# Patient Record
Sex: Male | Born: 1980 | Race: White | Hispanic: No | Marital: Married | Smoking: Current every day smoker
Health system: Southern US, Community
[De-identification: ages and names within clinical notes are randomized; demographics above are authoritative.]

## PROBLEM LIST (undated history)

## (undated) DIAGNOSIS — J45909 Unspecified asthma, uncomplicated: Secondary | ICD-10-CM

## (undated) HISTORY — PX: WISDOM TOOTH EXTRACTION: SHX21

## (undated) HISTORY — PX: TONSILLECTOMY: SUR1361

## (undated) HISTORY — DX: Unspecified asthma, uncomplicated: J45.909

## (undated) HISTORY — PX: OTHER SURGICAL HISTORY: SHX169

---

## 2004-09-29 ENCOUNTER — Emergency Department: Payer: Self-pay | Admitting: Internal Medicine

## 2005-11-21 ENCOUNTER — Emergency Department: Payer: Self-pay | Admitting: Emergency Medicine

## 2005-11-22 ENCOUNTER — Emergency Department: Payer: Self-pay | Admitting: Emergency Medicine

## 2005-12-10 ENCOUNTER — Emergency Department: Payer: Self-pay | Admitting: Emergency Medicine

## 2008-01-14 ENCOUNTER — Emergency Department: Payer: Self-pay | Admitting: Emergency Medicine

## 2009-07-25 ENCOUNTER — Emergency Department: Payer: Self-pay | Admitting: Emergency Medicine

## 2014-03-01 ENCOUNTER — Ambulatory Visit: Payer: Self-pay | Admitting: Neurology

## 2015-01-22 ENCOUNTER — Encounter: Payer: Self-pay | Admitting: Family Medicine

## 2015-01-22 ENCOUNTER — Ambulatory Visit (INDEPENDENT_AMBULATORY_CARE_PROVIDER_SITE_OTHER): Payer: Managed Care, Other (non HMO) | Admitting: Family Medicine

## 2015-01-22 VITALS — BP 114/76 | HR 92 | Temp 98.4°F | Ht 71.6 in | Wt 256.0 lb

## 2015-01-22 DIAGNOSIS — R52 Pain, unspecified: Secondary | ICD-10-CM | POA: Diagnosis not present

## 2015-01-22 DIAGNOSIS — R509 Fever, unspecified: Secondary | ICD-10-CM | POA: Diagnosis not present

## 2015-01-22 DIAGNOSIS — J209 Acute bronchitis, unspecified: Secondary | ICD-10-CM

## 2015-01-22 LAB — INFLUENZA A+B AG, EIA
INFLUENZA B AG, EIA: NEGATIVE
Influenza A Ag, EIA: NEGATIVE

## 2015-01-22 LAB — PLEASE NOTE:

## 2015-01-22 MED ORDER — HYDROCOD POLST-CPM POLST ER 10-8 MG/5ML PO SUER: 5.0000 mL | Freq: Two times a day (BID) | ORAL | Status: DC

## 2015-01-22 MED ORDER — BENZONATATE 200 MG PO CAPS: 200.0000 mg | ORAL_CAPSULE | Freq: Two times a day (BID) | ORAL | Status: DC | PRN

## 2015-01-22 MED ORDER — AZITHROMYCIN 250 MG PO TABS: ORAL_TABLET | ORAL | Status: DC

## 2015-01-22 NOTE — Progress Notes (Signed)
BP 114/76 mmHg  Pulse 92  Temp(Src) 98.4 F (36.9 C)  Ht 5' 11.6" (1.819 m)  Wt 256 lb (116.121 kg)  BMI 35.10 kg/m2  SpO2 97%   Subjective:    Patient ID: Jason Manning, male    DOB: 1981-03-07, 34 y.o.   MRN: [id number]  HPI: Jason Manning is a 34 y.o. male  Chief Complaint  Patient presents with  . URI    X 9 days, cough, SOB, sore throat, Fatigue, fever and body aches.   UPPER RESPIRATORY TRACT INFECTION- 1.5 weeks Worst symptom: coughing Fever: yes Cough: yes Shortness of breath: yes Wheezing: yes Chest pain: yes, with cough Chest tightness: yes Chest congestion: yes Nasal congestion: yes Runny nose: yes Post nasal drip: no Sneezing: no Sore throat: yes Swollen glands: no Sinus pressure: no Headache: yes Face pain: no Toothache: no Ear pain: no  Ear pressure: no  Eyes red/itching:no Eye drainage/crusting: no  Vomiting: yes Rash: no Fatigue: yes Sick contacts: yes Strep contacts: no  Context: worse and stable Recurrent sinusitis: no Relief with OTC cold/cough medications: yes  Treatments attempted: cold/sinus, mucinex, anti-histamine, pseudoephedrine and cough syrup   Relevant past medical, surgical, family and social history reviewed and updated as indicated. Interim medical history since our last visit reviewed. Allergies and medications reviewed and updated.  Review of Systems  Constitutional: Negative.   HENT: Negative.   Respiratory: Negative.   Cardiovascular: Negative.   Gastrointestinal: Negative.   Psychiatric/Behavioral: Negative.     Per HPI unless specifically indicated above     Objective:    BP 114/76 mmHg  Pulse 92  Temp(Src) 98.4 F (36.9 C)  Ht 5' 11.6" (1.819 m)  Wt 256 lb (116.121 kg)  BMI 35.10 kg/m2  SpO2 97%  Wt Readings from Last 3 Encounters:  01/22/15 256 lb (116.121 kg)  01/30/14 244 lb (110.678 kg)    Physical Exam  Constitutional: He is oriented to person, place, and time. He appears  well-developed and well-nourished. No distress.  HENT:  Head: Normocephalic and atraumatic.  Right Ear: Hearing and external ear normal.  Left Ear: Hearing normal.  Nose: Nose normal.  Mouth/Throat: Oropharynx is clear and moist. No oropharyngeal exudate.  Eyes: Conjunctivae, EOM and lids are normal. Pupils are equal, round, and reactive to light. Right eye exhibits no discharge. Left eye exhibits no discharge. No scleral icterus.  Neck: Normal range of motion. Neck supple. No JVD present. No tracheal deviation present. No thyromegaly present.  Cardiovascular: Normal rate, regular rhythm, normal heart sounds and intact distal pulses.  Exam reveals no gallop and no friction rub.   No murmur heard. Pulmonary/Chest: Effort normal and breath sounds normal. No stridor. No respiratory distress. He has no wheezes. He has no rales. He exhibits no tenderness.  Musculoskeletal: Normal range of motion.  Lymphadenopathy:    He has no cervical adenopathy.  Neurological: He is alert and oriented to person, place, and time.  Skin: Skin is warm, dry and intact. No rash noted. He is not diaphoretic. No erythema. No pallor.  Psychiatric: He has a normal mood and affect. His speech is normal and behavior is normal. Judgment and thought content normal. Cognition and memory are normal.  Nursing note and vitals reviewed.   No results found for this or any previous visit.    Assessment & Plan:   Problem List Items Addressed This Visit    None    Visit Diagnoses    Acute bronchitis, unspecified organism    -  Primary    Will treat as bronchitis as he has been sick for 10 days. Z-pack sent to his pharmacy. Will treat symptoms with tussionex and tessalon perles. Call if worse    Fever, unspecified fever cause        Will check for flu. Flu negative.     Relevant Orders    Influenza A+B Ag, EIA    Body aches        Will check for flu.     Relevant Orders    Influenza A+B Ag, EIA        Follow up  plan: Return if symptoms worsen or fail to improve.

## 2015-02-19 ENCOUNTER — Ambulatory Visit (INDEPENDENT_AMBULATORY_CARE_PROVIDER_SITE_OTHER): Payer: Managed Care, Other (non HMO) | Admitting: Family Medicine

## 2015-02-19 ENCOUNTER — Encounter: Payer: Self-pay | Admitting: Family Medicine

## 2015-02-19 VITALS — BP 138/81 | HR 94 | Temp 98.0°F | Ht 71.0 in | Wt 252.0 lb

## 2015-02-19 DIAGNOSIS — J019 Acute sinusitis, unspecified: Secondary | ICD-10-CM | POA: Diagnosis not present

## 2015-02-19 DIAGNOSIS — Z720 Tobacco use: Secondary | ICD-10-CM

## 2015-02-19 MED ORDER — AMOXICILLIN-POT CLAVULANATE 875-125 MG PO TABS: 1.0000 | ORAL_TABLET | Freq: Two times a day (BID) | ORAL | Status: DC

## 2015-02-19 NOTE — Assessment & Plan Note (Signed)
Discussed smoking cessation use of over-the-counter products

## 2015-02-19 NOTE — Assessment & Plan Note (Signed)
Discussed sinusitis care and treatment Use of over-the-counter medications smoking cessation Discuss use of Augmentin and recommendations for 12 hours apart.

## 2015-02-19 NOTE — Progress Notes (Signed)
BP 138/81 mmHg  Pulse 94  Temp(Src) 98 F (36.7 C)  Ht 5\' 11"  (1.803 m)  Wt 252 lb (114.306 kg)  BMI 35.16 kg/m2  SpO2 98%   Subjective:    Patient ID: Jason LoftsAdam Richard Iiams, male    DOB: 04/29/1980, 34 y.o.   MRN: [id number]  HPI: Jason Loftsdam Richard Veenstra is a 34 y.o. male  Chief Complaint  Patient presents with  . paper work    health screening   patient with ongoing illness is been ongoing for over a month. Had a round of Z-Pak and erythromycin one from the walk-in 1 from here. Patient had x-ray showing pneumonia. Patient's has continued to work has a great deal of congestion some low-grade fever may be slightly better but still very sick. Has taken over-the-counter medications  Patient also trying to quit smoking is cut back from 2 packs a day to 3 force of the pack this is been over this last month. Also has paperwork to fill out for still smoking for his insurance.  Relevant past medical, surgical, family and social history reviewed and updated as indicated. Interim medical history since our last visit reviewed. Allergies and medications reviewed and updated.  Review of Systems  Constitutional: Positive for fever, chills and fatigue.  HENT: Positive for congestion, ear pain, rhinorrhea, sinus pressure, sore throat and voice change.   Eyes: Negative.   Respiratory: Positive for cough and wheezing.   Cardiovascular: Negative.   Genitourinary: Negative.     Per HPI unless specifically indicated above     Objective:    BP 138/81 mmHg  Pulse 94  Temp(Src) 98 F (36.7 C)  Ht 5\' 11"  (1.803 m)  Wt 252 lb (114.306 kg)  BMI 35.16 kg/m2  SpO2 98%  Wt Readings from Last 3 Encounters:  02/19/15 252 lb (114.306 kg)  01/22/15 256 lb (116.121 kg)  01/30/14 244 lb (110.678 kg)    Physical Exam  Constitutional: He is oriented to person, place, and time. He appears well-developed and well-nourished. No distress.  HENT:  Head: Normocephalic and atraumatic.  Right Ear:  Hearing and external ear normal.  Left Ear: Hearing and external ear normal.  Nose: Nose normal.  Mouth/Throat: Oropharynx is clear and moist.  Eyes: Conjunctivae and lids are normal. Right eye exhibits no discharge. Left eye exhibits no discharge. No scleral icterus.  Neck: Normal range of motion.  Cardiovascular: Normal rate, regular rhythm and normal heart sounds.   Pulmonary/Chest: Effort normal and breath sounds normal. No respiratory distress.  rhonchi  Musculoskeletal: Normal range of motion.  Lymphadenopathy:    He has no cervical adenopathy.  Neurological: He is alert and oriented to person, place, and time.  Skin: Skin is intact. No rash noted.  Psychiatric: He has a normal mood and affect. His speech is normal and behavior is normal. Judgment and thought content normal. Cognition and memory are normal.    Results for orders placed or performed in visit on 01/22/15  Influenza A+B Ag, EIA  Result Value Ref Range   Influenza A Ag, EIA Negative Negative   Influenza B Ag, EIA Negative Negative   Influenza Comment See note   Please note:  Result Value Ref Range   Please note: Comment       Assessment & Plan:   Problem List Items Addressed This Visit      Respiratory   Sinusitis, acute - Primary    Discussed sinusitis care and treatment Use of over-the-counter medications smoking cessation  Discuss use of Augmentin and recommendations for 12 hours apart.      Relevant Medications   amoxicillin-clavulanate (AUGMENTIN) 875-125 MG tablet     Other   Tobacco abuse    Discussed smoking cessation use of over-the-counter products          Follow up plan: Return for Physical Exam this winter.

## 2016-10-05 IMAGING — MR MRI HEAD WITHOUT AND WITH CONTRAST
9 of 11 series · 30 of 48 positions shown · IV contrast (multihance)
Comparison: CT head without contrast 01/14/2008

CLINICAL DATA: Episode of confusion with metallic taste in his
mouth 3 weeks ago. Dizziness. Possible seizure.

EXAM:
MRI HEAD WITHOUT AND WITH CONTRAST
TECHNIQUE: Multiplanar, multiecho pulse sequences of the brain and surrounding
structures were obtained without and with intravenous contrast.
CONTRAST:  20 mL MultiHance

[Series 2: T1 · sagittal · 5.0mm · 0.45mm/px · 2 of 29 slices shown]
[im 1/29]
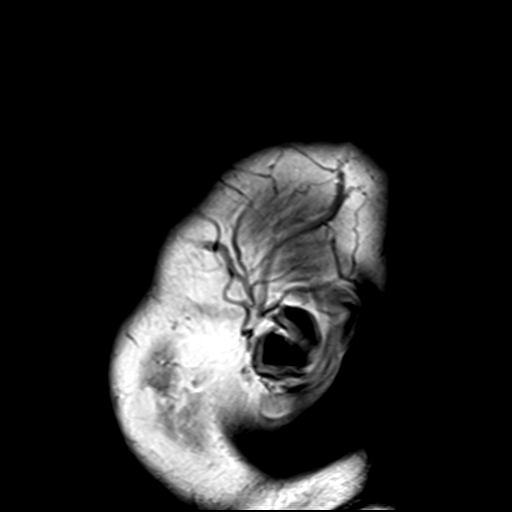
[im 10/29]
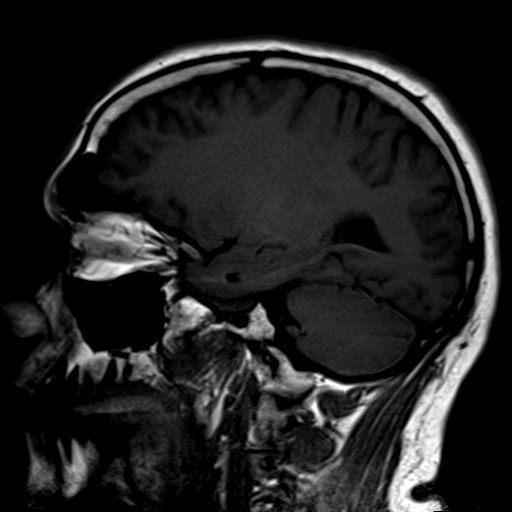

[Series 4: DWI · axial · 5.0mm · 1.80mm/px · z∈[-90,+78]mm · 3 of 27 slices shown (1 of 2)]
[im 1/27]
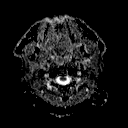
[im 14/27]
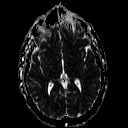
[im 27/27]
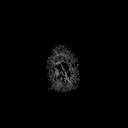

[Series 5: T2 · axial · 5.0mm · 0.60mm/px · z∈[-89,+79]mm · 3 of 27 slices shown (1 of 4)]
[im 1/27]
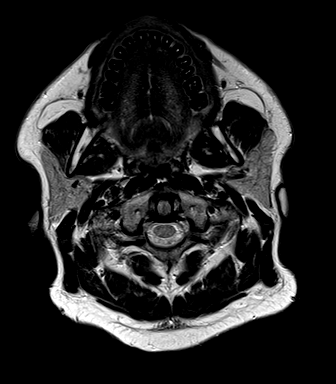
[im 14/27]
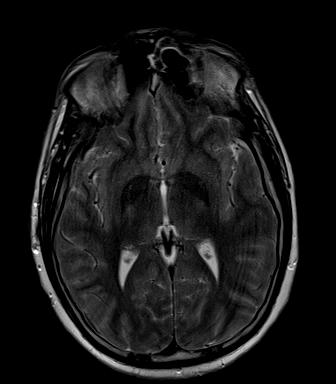
[im 27/27]
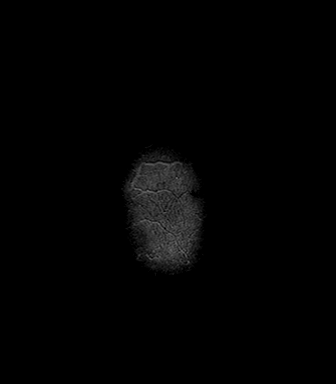

[Series 7: FLAIR · axial · 5.0mm · 0.45mm/px · z∈[-90,+78]mm · 3 of 27 slices shown]
[im 1/27]
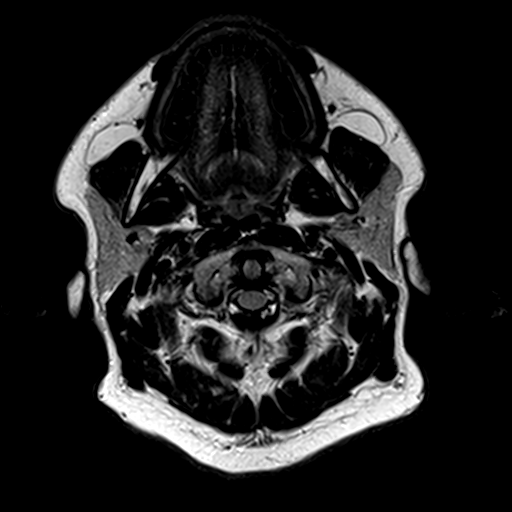
[im 14/27]
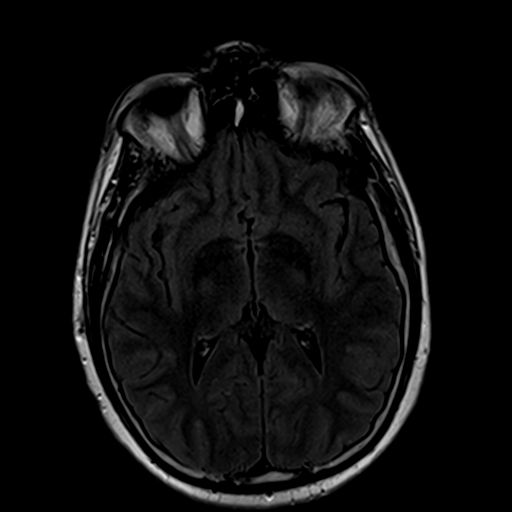
[im 27/27]
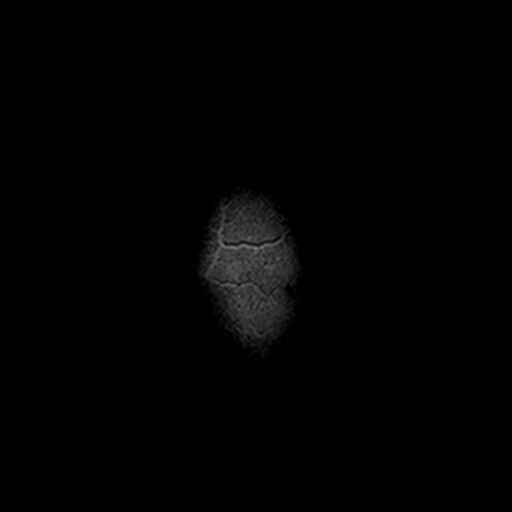

[Series 8: T2 · axial · 5.0mm · 0.45mm/px · z∈[-89,+79]mm · 3 of 27 slices shown (2 of 4)]
[im 1/27]
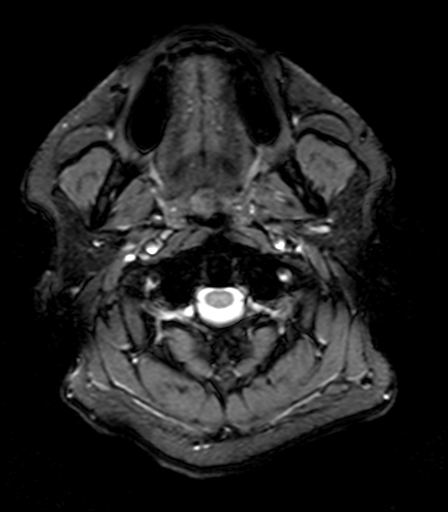
[im 14/27]
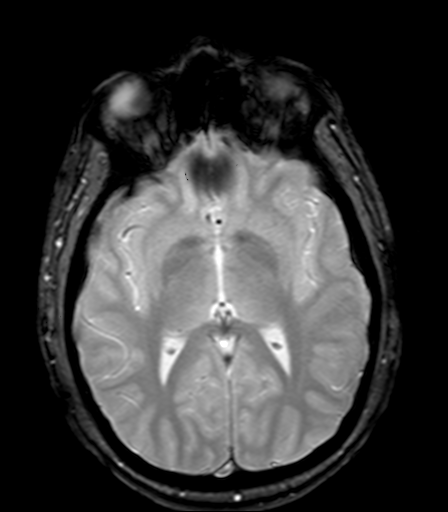
[im 27/27]
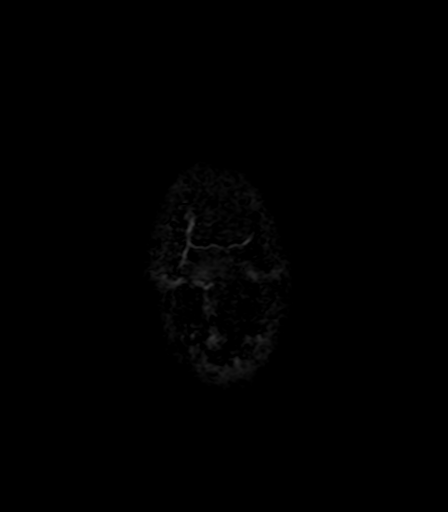

[Series 10: T2 · coronal · 3.0mm · 0.47mm/px · 5 of 37 slices shown (3 of 4)]
[im 1/37]
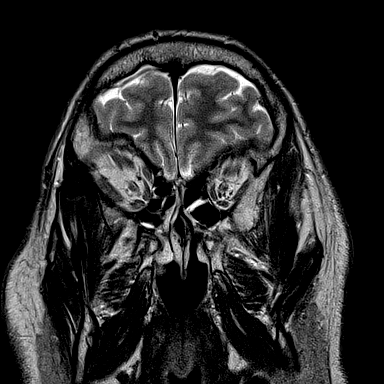
[im 10/37]
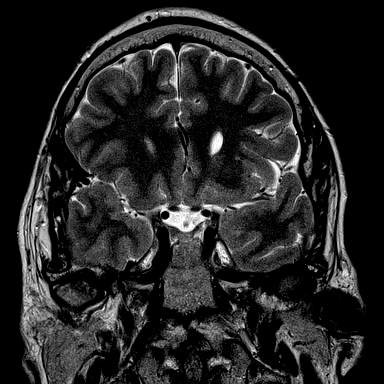
[im 19/37]
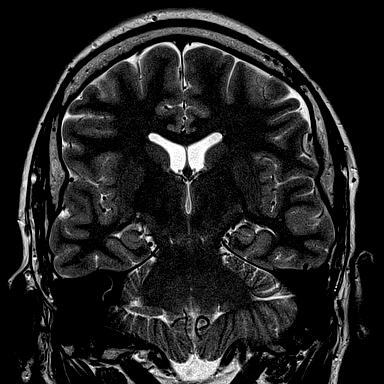
[im 28/37]
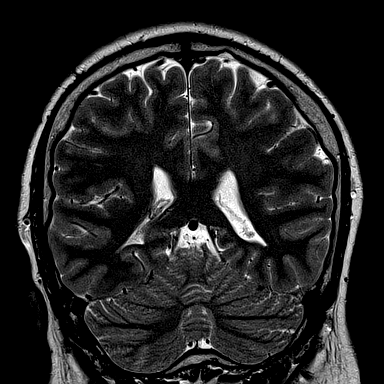
[im 37/37]
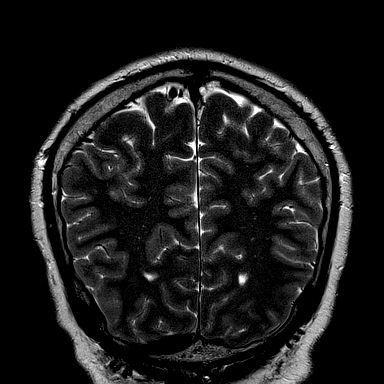

[Series 11: T2 · coronal · 5.0mm · 0.49mm/px · 4 of 31 slices shown (4 of 4)]
[im 1/31]
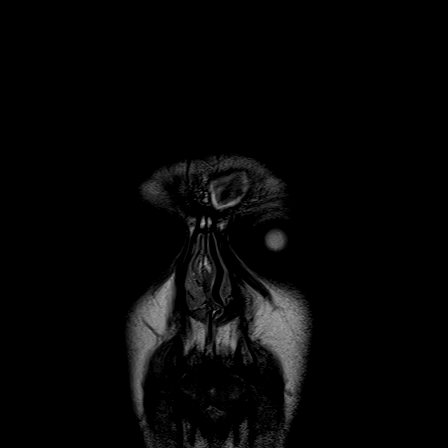
[im 11/31]
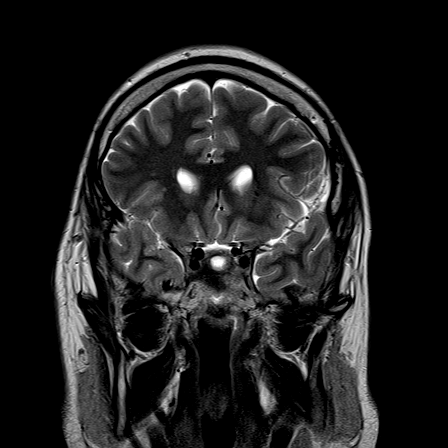
[im 21/31]
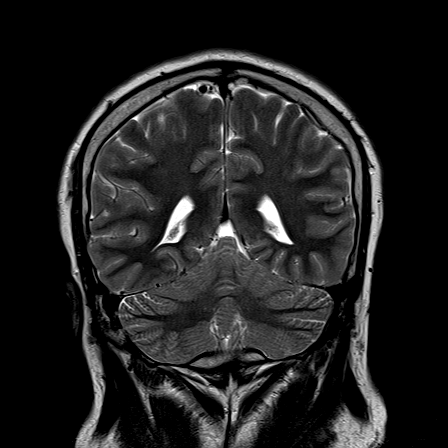
[im 31/31]
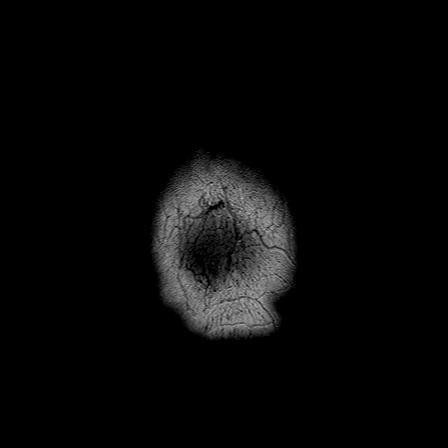

[Series 13: T1 post-contrast · coronal · 5.0mm · 0.43mm/px · 4 of 31 slices shown]
[im 1/31]
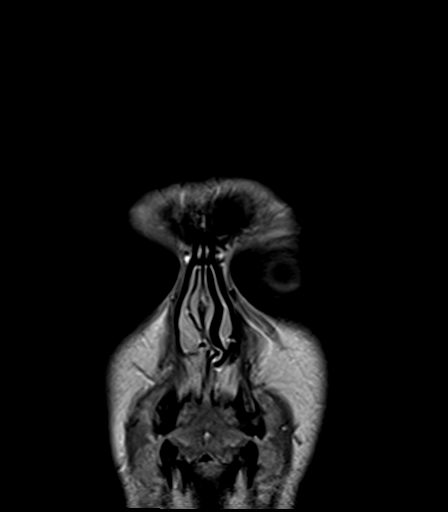
[im 11/31]
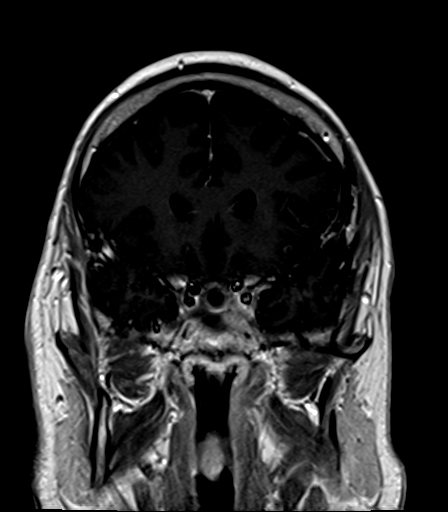
[im 21/31]
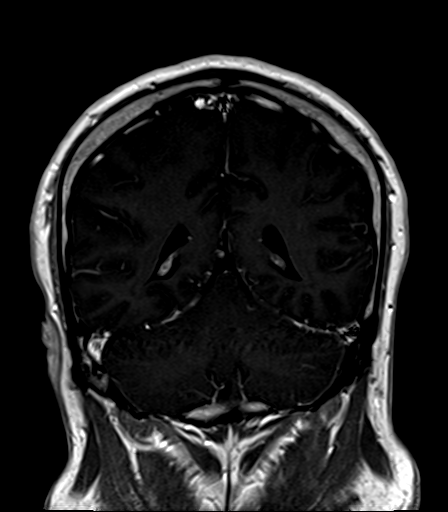
[im 31/31]
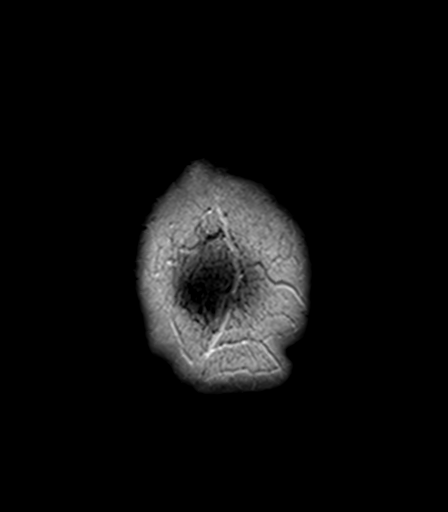

[Series 100: DWI · axial · 5.0mm · 1.80mm/px · z∈[-90,+78]mm · 3 of 27 slices shown (2 of 2)]
[im 1/27]
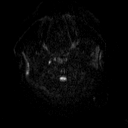
[im 14/27]
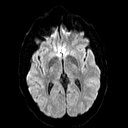
[im 27/27]
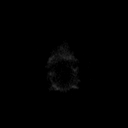

[30 of 48 positions shown; findings below may reference images not displayed]

FINDINGS: No acute infarct, hemorrhage, or mass lesion is present. A single
subcortical T2 hyperintensity is present in the left frontal lobe
measuring 5.5 mm. No other significant white matter disease is
present. The ventricles are of normal size. No significant
extraaxial fluid collection is present.

Flow is present in the major intracranial arteries. A polyp or
mucous retention cyst is present in the inferomedial left maxillary
sinus. Mild mucosal thickening is present throughout the paranasal
sinuses. The mastoid air cells are clear.

The postcontrast images demonstrate no pathologic enhancement is
present.
IMPRESSION: 1. 5.5 mm subcortical T2 hyperintensity in the anterior left frontal
lobe is nonspecific. This may be within normal limits. A
demyelinating process or association with chronic migraine headaches
is also considered.
2. No acute intracranial abnormality.
3. Minimal sinus disease.

## 2016-12-30 ENCOUNTER — Encounter: Payer: Managed Care, Other (non HMO) | Admitting: Family Medicine

## 2017-03-24 ENCOUNTER — Ambulatory Visit: Payer: Managed Care, Other (non HMO) | Admitting: Family Medicine

## 2017-03-24 ENCOUNTER — Encounter: Payer: Self-pay | Admitting: Family Medicine

## 2017-03-24 VITALS — BP 126/83 | HR 109 | Temp 97.8°F | Ht 72.25 in | Wt 263.0 lb

## 2017-03-24 DIAGNOSIS — Z0000 Encounter for general adult medical examination without abnormal findings: Secondary | ICD-10-CM

## 2017-03-24 DIAGNOSIS — Z720 Tobacco use: Secondary | ICD-10-CM

## 2017-03-24 LAB — UA/M W/RFLX CULTURE, ROUTINE
BILIRUBIN UA: NEGATIVE
Glucose, UA: NEGATIVE
LEUKOCYTES UA: NEGATIVE
NITRITE UA: NEGATIVE
PH UA: 5.5 (ref 5.0–7.5)
Protein, UA: NEGATIVE
RBC UA: NEGATIVE
SPEC GRAV UA: 1.025 (ref 1.005–1.030)
Urobilinogen, Ur: 0.2 mg/dL (ref 0.2–1.0)

## 2017-03-24 MED ORDER — VARENICLINE TARTRATE 0.5 MG X 11 & 1 MG X 42 PO MISC: ORAL | 0 refills | Status: DC

## 2017-03-24 NOTE — Assessment & Plan Note (Signed)
Ready to quit smoking, wanting to try chantix rather than OTC options. Discussed possible cost, pt aware. Risks and benefits reviewed, particularly suicidality. Discussed habit/routine replacement and avoiding exposure to friends and family smoking around

## 2017-03-24 NOTE — Progress Notes (Signed)
BP 126/83 (BP Location: Left Arm, Patient Position: Sitting, Cuff Size: Large)   Pulse (!) 109   Temp 97.8 F (36.6 C) (Oral)   Ht 6' 0.25" (1.835 m)   Wt 263 lb (119.3 kg)   SpO2 99%   BMI 35.42 kg/m    Subjective:    Patient ID: Jason Manning, male    DOB: 01-Sep-1980, 36 y.o.   MRN: [id number]  HPI: Jason Manning is a 36 y.o. male presenting on 03/24/2017 for comprehensive medical examination. Current medical complaints include:wanting to quit smoking. Quit cold [place] for a month several years ago but started back up after a night out with his buddies who smoke. Has never tried any means to quit.   Not fasting today for labs.   He currently lives with: wife and children Interim Problems from his last visit: no  Depression Screen done today and results listed below:  No flowsheet data found.  The patient does not have a history of falls. I did not complete a risk assessment for falls. A plan of care for falls was not documented.   Past Medical History:  Past Medical History:  Diagnosis Date  . Asthma     Surgical History:  Past Surgical History:  Procedure Laterality Date  . fracture of leg Left    Rod Placed  . reattachment of fingers Left   . TONSILLECTOMY    . WISDOM TOOTH EXTRACTION      Medications:  No current outpatient medications on file prior to visit.   No current facility-administered medications on file prior to visit.     Allergies:  No Known Allergies  Social History:  Social History   Socioeconomic History  . Marital status: Married    Spouse name: Not on file  . Number of children: Not on file  . Years of education: Not on file  . Highest education level: Not on file  Social Needs  . Financial resource strain: Not on file  . Food insecurity - worry: Not on file  . Food insecurity - inability: Not on file  . Transportation needs - medical: Not on file  . Transportation needs - non-medical: Not on file  Occupational  History  . Not on file  Tobacco Use  . Smoking status: Current Every Day Smoker    Packs/day: 2.00    Types: Cigarettes  . Smokeless tobacco: Never Used  Substance and Sexual Activity  . Alcohol use: No  . Drug use: Yes    Types: Marijuana  . Sexual activity: Yes  Other Topics Concern  . Not on file  Social History Narrative  . Not on file   Social History   Tobacco Use  Smoking Status Current Every Day Smoker  . Packs/day: 2.00  . Types: Cigarettes  Smokeless Tobacco Never Used   Social History   Substance and Sexual Activity  Alcohol Use No    Family History:  Family History  Problem Relation Age of Onset  . Thyroid disease Mother   . Hypertension Mother   . Lung disease Maternal Grandfather   . Heart disease Maternal Grandfather     Past medical history, surgical history, medications, allergies, family history and social history reviewed with patient today and changes made to appropriate areas of the chart.   Review of Systems - General ROS: negative Psychological ROS: negative Ophthalmic ROS: negative ENT ROS: negative Breast ROS: negative for breast lumps Respiratory ROS: no cough, shortness of breath, or wheezing Cardiovascular  ROS: no chest pain or dyspnea on exertion Gastrointestinal ROS: no abdominal pain, change in bowel habits, or black or bloody stools Genito-Urinary ROS: no dysuria, trouble voiding, or hematuria Musculoskeletal ROS: negative Neurological ROS: no TIA or stroke symptoms Dermatological ROS: negative All other ROS negative except what is listed above and in the HPI.      Objective:    BP 126/83 (BP Location: Left Arm, Patient Position: Sitting, Cuff Size: Large)   Pulse (!) 109   Temp 97.8 F (36.6 C) (Oral)   Ht 6' 0.25" (1.835 m)   Wt 263 lb (119.3 kg)   SpO2 99%   BMI 35.42 kg/m   Wt Readings from Last 3 Encounters:  03/24/17 263 lb (119.3 kg)  02/19/15 252 lb (114.3 kg)  01/22/15 256 lb (116.1 kg)    Physical Exam    Constitutional: He is oriented to person, place, and time. He appears well-developed and well-nourished. No distress.  HENT:  Head: Atraumatic.  Right Ear: External ear normal.  Left Ear: External ear normal.  Nose: Nose normal.  Mouth/Throat: Oropharynx is clear and moist.  Eyes: Conjunctivae are normal. Pupils are equal, round, and reactive to light. No scleral icterus.  Neck: Normal range of motion. Neck supple.  Cardiovascular: Normal rate, regular rhythm, normal heart sounds and intact distal pulses.  No murmur heard. Pulmonary/Chest: Effort normal and breath sounds normal. No respiratory distress.  Abdominal: Soft. Bowel sounds are normal. He exhibits no distension and no mass. There is no tenderness. There is no guarding.  Musculoskeletal: Normal range of motion. He exhibits no edema or tenderness.  Neurological: He is alert and oriented to person, place, and time. He has normal reflexes.  Skin: Skin is warm and dry. No rash noted.  Psychiatric: He has a normal mood and affect. His behavior is normal.  Nursing note and vitals reviewed.   Results for orders placed or performed in visit on 01/22/15  Influenza A+B Ag, EIA  Result Value Ref Range   Influenza A Ag, EIA Negative Negative   Influenza B Ag, EIA Negative Negative   Influenza Comment See note   Please note:  Result Value Ref Range   Please note: Comment       Assessment & Plan:   Problem List Items Addressed This Visit      Other   Tobacco abuse - Primary    Ready to quit smoking, wanting to try chantix rather than OTC options. Discussed possible cost, pt aware. Risks and benefits reviewed, particularly suicidality. Discussed habit/routine replacement and avoiding exposure to friends and family smoking around       Other Visit Diagnoses    Annual physical exam       Non-fasting labs done today. Declines flu shot, UTD otherwise.    Relevant Orders   CBC with Differential/Platelet   Comprehensive metabolic  panel   Lipid Panel w/o Chol/HDL Ratio   TSH   UA/M w/rflx Culture, Routine       Discussed aspirin prophylaxis for myocardial infarction prevention and decision was it was not indicated  LABORATORY TESTING:  Health maintenance labs ordered today as discussed above.   IMMUNIZATIONS:   - Tdap: Tetanus vaccination status reviewed: last tetanus booster within 10 years. - Influenza: Refused  PATIENT COUNSELING:    Sexuality: Discussed sexually transmitted diseases, partner selection, use of condoms, avoidance of unintended pregnancy  and contraceptive alternatives.   Advised to avoid cigarette smoking.  I discussed with the patient that most people  either abstain from alcohol or drink within safe limits (<=14/week and <=4 drinks/occasion for males, <=7/weeks and <= 3 drinks/occasion for females) and that the risk for alcohol disorders and other health effects rises proportionally with the number of drinks per week and how often a drinker exceeds daily limits.  Discussed cessation/primary prevention of drug use and availability of treatment for abuse.   Diet: Encouraged to adjust caloric intake to maintain  or achieve ideal body weight, to reduce intake of dietary saturated fat and total fat, to limit sodium intake by avoiding high sodium foods and not adding table salt, and to maintain adequate dietary potassium and calcium preferably from fresh fruits, vegetables, and low-fat dairy products.    stressed the importance of regular exercise  Injury prevention: Discussed safety belts, safety helmets, smoke detector, smoking near bedding or upholstery.   Dental health: Discussed importance of regular tooth brushing, flossing, and dental visits.   Follow up plan: NEXT PREVENTATIVE PHYSICAL DUE IN 1 YEAR. Return in about 3 months (around 06/22/2017) for Smoking cessation.

## 2017-03-25 LAB — COMPREHENSIVE METABOLIC PANEL WITH GFR
ALT: 16 IU/L (ref 0–44)
AST: 17 IU/L (ref 0–40)
Albumin/Globulin Ratio: 2.1 (ref 1.2–2.2)
Albumin: 4.5 g/dL (ref 3.5–5.5)
Alkaline Phosphatase: 61 IU/L (ref 39–117)
BUN/Creatinine Ratio: 16 (ref 9–20)
BUN: 18 mg/dL (ref 6–20)
Bilirubin Total: 0.5 mg/dL (ref 0.0–1.2)
CO2: 25 mmol/L (ref 20–29)
Calcium: 9.7 mg/dL (ref 8.7–10.2)
Chloride: 105 mmol/L (ref 96–106)
Creatinine, Ser: 1.1 mg/dL (ref 0.76–1.27)
GFR calc Af Amer: 99 mL/min/1.73
GFR calc non Af Amer: 86 mL/min/1.73
Globulin, Total: 2.1 g/dL (ref 1.5–4.5)
Glucose: 80 mg/dL (ref 65–99)
Potassium: 4.5 mmol/L (ref 3.5–5.2)
Sodium: 142 mmol/L (ref 134–144)
Total Protein: 6.6 g/dL (ref 6.0–8.5)

## 2017-03-25 LAB — CBC WITH DIFFERENTIAL/PLATELET
Basophils Absolute: 0 x10E3/uL (ref 0.0–0.2)
Basos: 0 %
EOS (ABSOLUTE): 0.4 x10E3/uL (ref 0.0–0.4)
Eos: 3 %
Hematocrit: 42.2 % (ref 37.5–51.0)
Hemoglobin: 14.4 g/dL (ref 13.0–17.7)
Immature Grans (Abs): 0 x10E3/uL (ref 0.0–0.1)
Immature Granulocytes: 0 %
Lymphocytes Absolute: 3.4 x10E3/uL — ABNORMAL HIGH (ref 0.7–3.1)
Lymphs: 31 %
MCH: 30.4 pg (ref 26.6–33.0)
MCHC: 34.1 g/dL (ref 31.5–35.7)
MCV: 89 fL (ref 79–97)
Monocytes Absolute: 0.8 x10E3/uL (ref 0.1–0.9)
Monocytes: 8 %
Neutrophils Absolute: 6.3 x10E3/uL (ref 1.4–7.0)
Neutrophils: 58 %
Platelets: 262 x10E3/uL (ref 150–379)
RBC: 4.73 x10E6/uL (ref 4.14–5.80)
RDW: 13 % (ref 12.3–15.4)
WBC: 11 x10E3/uL — ABNORMAL HIGH (ref 3.4–10.8)

## 2017-03-25 LAB — TSH: TSH: 1.15 u[IU]/mL (ref 0.450–4.500)

## 2017-03-25 LAB — LIPID PANEL W/O CHOL/HDL RATIO
CHOLESTEROL TOTAL: 208 mg/dL — AB (ref 100–199)
HDL: 39 mg/dL — ABNORMAL LOW (ref >39)
LDL CALC: 147 mg/dL — AB (ref 0–99)
Triglycerides: 112 mg/dL (ref 0–149)
VLDL Cholesterol Cal: 22 mg/dL (ref 5–40)

## 2017-03-27 ENCOUNTER — Telehealth: Payer: Self-pay | Admitting: Family Medicine

## 2017-03-27 ENCOUNTER — Telehealth: Payer: Self-pay

## 2017-03-27 NOTE — Telephone Encounter (Signed)
[facility] Result was given to patient by me.

## 2017-03-27 NOTE — Telephone Encounter (Signed)
Noted and faxing back to pharmacy.

## 2017-03-27 NOTE — Telephone Encounter (Signed)
Keep chantix - if patient decides cost is too high he can call and ask for cheaper alternatives

## 2017-03-27 NOTE — Telephone Encounter (Signed)
Prior Authorization from [facility] for chantix. Patient must first try and fail: Bupropion HCL ER, Nicotine Placrilex.  See where you documented you discussed possible cost w/ patient and he understood. Keep chantix or change?

## 2017-03-27 NOTE — Telephone Encounter (Signed)
Copied from CRM [phone]. Topic: Inquiry >> Mar 27, 2017  3:59 PM Stephannie LiSimmons, Allysa Governale L, NT wrote: Reason for CRM: Patient returned call from office please call again [phone]

## 2019-05-13 ENCOUNTER — Telehealth: Payer: Self-pay | Admitting: Family Medicine

## 2019-05-13 NOTE — Telephone Encounter (Signed)
Patient's wife, Marke Goodwyn is calling to ask if [facility] would take the paitent her husband on as a new patient. Please advise Cb- [phone]

## 2019-05-14 NOTE — Telephone Encounter (Signed)
This is fine.  JB

## 2019-06-24 ENCOUNTER — Ambulatory Visit: Payer: BC Managed Care – PPO | Admitting: Physician Assistant

## 2019-06-24 ENCOUNTER — Encounter: Payer: Self-pay | Admitting: Physician Assistant

## 2019-06-24 VITALS — BP 141/87 | HR 109 | Temp 97.5°F | Ht 73.0 in | Wt 280.0 lb

## 2019-06-24 DIAGNOSIS — Z23 Encounter for immunization: Secondary | ICD-10-CM

## 2019-06-24 DIAGNOSIS — Z1329 Encounter for screening for other suspected endocrine disorder: Secondary | ICD-10-CM | POA: Diagnosis not present

## 2019-06-24 DIAGNOSIS — Z13220 Encounter for screening for lipoid disorders: Secondary | ICD-10-CM

## 2019-06-24 DIAGNOSIS — Z114 Encounter for screening for human immunodeficiency virus [HIV]: Secondary | ICD-10-CM

## 2019-06-24 DIAGNOSIS — Z131 Encounter for screening for diabetes mellitus: Secondary | ICD-10-CM

## 2019-06-24 DIAGNOSIS — Z136 Encounter for screening for cardiovascular disorders: Secondary | ICD-10-CM

## 2019-06-24 DIAGNOSIS — Z0000 Encounter for general adult medical examination without abnormal findings: Secondary | ICD-10-CM

## 2019-06-24 NOTE — Progress Notes (Signed)
Patient: Jason Manning, Male    DOB: Jan 21, 1981, 39 y.o.   MRN: [id number] Visit Date: 06/24/2019  Today's Provider: Mar Daring, PA-C   Chief Complaint  Patient presents with  . Establish Care   Subjective:     Establish Care Jason Manning is a 39 y.o. male who presents today to establish care. He feels well. He reports exercising regularly. He reports he is sleeping fairly well.  He is married with children. He works as a Horticulturist, commercial for [employer]. He does smoke 2 PPD. He does not drink. He does use marijuana occasionally.  -----------------------------------------------------------------   Review of Systems  Constitutional: Negative.   HENT: Negative.   Eyes: Negative.   Respiratory: Negative.   Cardiovascular: Negative.   Gastrointestinal: Negative.   Endocrine: Negative.   Genitourinary: Negative.   Musculoskeletal: Negative.   Skin: Negative.   Allergic/Immunologic: Negative.   Neurological: Negative.   Hematological: Negative.   Psychiatric/Behavioral: Negative.     Social History      He  reports that he has been smoking cigarettes. He has been smoking about 2.00 packs per day. He has never used smokeless tobacco. He reports current drug use. Drug: Marijuana. He reports that he does not drink alcohol.       Social History   Socioeconomic History  . Marital status: Married    Spouse name: Not on file  . Number of children: Not on file  . Years of education: Not on file  . Highest education level: Not on file  Occupational History  . Not on file  Tobacco Use  . Smoking status: Current Every Day Smoker    Packs/day: 2.00    Types: Cigarettes  . Smokeless tobacco: Never Used  Substance and Sexual Activity  . Alcohol use: No  . Drug use: Yes    Types: Marijuana  . Sexual activity: Yes  Other Topics Concern  . Not on file  Social History Narrative  . Not on file   Social Determinants of Health   Financial Resource  Strain:   . Difficulty of Paying Living Expenses: Not on file  Food Insecurity:   . Worried About Charity fundraiser in the Last Year: Not on file  . Ran Out of Food in the Last Year: Not on file  Transportation Needs:   . Lack of Transportation (Medical): Not on file  . Lack of Transportation (Non-Medical): Not on file  Physical Activity:   . Days of Exercise per Week: Not on file  . Minutes of Exercise per Session: Not on file  Stress:   . Feeling of Stress : Not on file  Social Connections:   . Frequency of Communication with Friends and Family: Not on file  . Frequency of Social Gatherings with Friends and Family: Not on file  . Attends Religious Services: Not on file  . Active Member of Clubs or Organizations: Not on file  . Attends Archivist Meetings: Not on file  . Marital Status: Not on file    Past Medical History:  Diagnosis Date  . Asthma      Patient Active Problem List   Diagnosis Date Noted  . Sinusitis, acute 02/19/2015  . Tobacco abuse 02/19/2015    Past Surgical History:  Procedure Laterality Date  . fracture of leg Left    Rod Placed  . reattachment of fingers Left   . TONSILLECTOMY    . WISDOM TOOTH EXTRACTION  Family History        Family Status  Relation Name Status  . Mother  Alive  . Father  Alive  . Brother  Alive  . Daughter  Alive  . MGM  Deceased  . MGF  Deceased  . PGM  Alive  . PGF  Deceased  . Daughter  Alive        His family history includes Healthy in his father; Heart disease in his maternal grandfather; Hypertension in his mother; Lung disease in his maternal grandfather; Thyroid disease in his mother.      No Known Allergies   Current Outpatient Medications:  .  varenicline (CHANTIX STARTING MONTH PAK) 0.5 MG X 11 & 1 MG X 42 tablet, Take one 0.5 mg tablet by mouth once daily for 3 days, then increase to one 0.5 mg tablet twice daily for 4 days, then increase to one 1 mg tablet twice daily., Disp: 53  tablet, Rfl: 0   Patient Care Team: Margaretann Loveless, PA-C as PCP - General (Family Medicine)    Objective:    Vitals: BP (!) 141/87 (BP Location: Left Arm, Patient Position: Sitting, Cuff Size: Large)   Pulse (!) 109   Temp (!) 97.5 F (36.4 C) (Temporal)   Ht 6\' 1"  (1.854 m)   Wt 280 lb (127 kg)   BMI 36.94 kg/m    Vitals:   06/24/19 1610  BP: (!) 141/87  Pulse: (!) 109  Temp: (!) 97.5 F (36.4 C)  TempSrc: Temporal  Weight: 280 lb (127 kg)  Height: 6\' 1"  (1.854 m)     Physical Exam Vitals reviewed.  Constitutional:      General: He is not in acute distress.    Appearance: Normal appearance. He is well-developed. He is obese. He is not ill-appearing.  HENT:     Head: Normocephalic and atraumatic.     Right Ear: Tympanic membrane, ear canal and external ear normal.     Left Ear: Tympanic membrane, ear canal and external ear normal.  Eyes:     General: No scleral icterus.       Right eye: No discharge.        Left eye: No discharge.     Extraocular Movements: Extraocular movements intact.     Conjunctiva/sclera: Conjunctivae normal.     Pupils: Pupils are equal, round, and reactive to light.  Neck:     Thyroid: No thyromegaly.     Trachea: No tracheal deviation.  Cardiovascular:     Rate and Rhythm: Normal rate and regular rhythm.     Pulses: Normal pulses.     Heart sounds: Normal heart sounds. No murmur.  Pulmonary:     Effort: Pulmonary effort is normal. No respiratory distress.     Breath sounds: Normal breath sounds. No wheezing or rales.  Chest:     Chest wall: No tenderness.  Abdominal:     General: Bowel sounds are normal. There is no distension.     Palpations: Abdomen is soft. There is no mass.     Tenderness: There is no abdominal tenderness. There is no guarding or rebound.  Musculoskeletal:        General: No tenderness. Normal range of motion.     Cervical back: Normal range of motion and neck supple.     Right lower leg: No edema.      Left lower leg: No edema.  Lymphadenopathy:     Cervical: No cervical adenopathy.  Skin:  General: Skin is warm and dry.     Capillary Refill: Capillary refill takes less than 2 seconds.     Findings: No erythema or rash.  Neurological:     General: No focal deficit present.     Mental Status: He is alert and oriented to person, place, and time. Mental status is at baseline.     Cranial Nerves: No cranial nerve deficit.     Motor: No abnormal muscle tone.     Coordination: Coordination normal.     Deep Tendon Reflexes: Reflexes are normal and symmetric. Reflexes normal.  Psychiatric:        Mood and Affect: Mood normal.        Behavior: Behavior normal.        Thought Content: Thought content normal.        Judgment: Judgment normal.      Depression Screen PHQ 2/9 Scores 03/24/2017  PHQ - 2 Score 0  PHQ- 9 Score 0       Assessment & Plan:     Routine Health Maintenance and Physical Exam  Exercise Activities and Dietary recommendations Goals   None     Immunization History  Administered Date(s) Administered  . Td 04/25/2006    Health Maintenance  Topic Date Due  . HIV Screening  12/25/1995  . TETANUS/TDAP  04/25/2016  . INFLUENZA VACCINE  11/24/2018     Discussed health benefits of physical activity, and encouraged him to engage in regular exercise appropriate for his age and condition.    1. Annual physical exam Normal physical exam today. Will check labs as below and f/u pending lab results. If labs are stable and WNL he will not need to have these rechecked for one year at his next annual physical exam. He is to call the office in the meantime if he has any acute issue, questions or concerns. - CBC w/Diff/Platelet - Comprehensive Metabolic Panel (CMET) - TSH - Lipid Panel With LDL/HDL Ratio - HgB A1c  2. Thyroid disorder screen Will check labs as below and f/u pending results. - TSH  3. Encounter for lipid screening for cardiovascular  disease Will check labs as below and f/u pending results. - Lipid Panel With LDL/HDL Ratio  4. Diabetes mellitus screening Will check labs as below and f/u pending results. - HgB A1c  5. Screening for HIV without presence of risk factors Will check labs as below and f/u pending results. - HIV antibody (with reflex)  6. Need for Td vaccine Tdap Vaccine given to patient without complications. Patient sat for 15 minutes after administration and was tolerated well without adverse effects. - Tdap vaccine greater than or equal to 7yo IM   --------------------------------------------------------------------    Margaretann Loveless, PA-C  [facility]

## 2019-06-24 NOTE — Patient Instructions (Signed)

## 2019-06-26 NOTE — Addendum Note (Signed)
Addended by: Margaretann Loveless on: 06/26/2019 04:54 PM   Modules accepted: Level of Service

## 2019-07-26 DIAGNOSIS — Z1329 Encounter for screening for other suspected endocrine disorder: Secondary | ICD-10-CM | POA: Diagnosis not present

## 2019-07-26 DIAGNOSIS — Z13220 Encounter for screening for lipoid disorders: Secondary | ICD-10-CM | POA: Diagnosis not present

## 2019-07-26 DIAGNOSIS — Z136 Encounter for screening for cardiovascular disorders: Secondary | ICD-10-CM | POA: Diagnosis not present

## 2019-07-26 DIAGNOSIS — Z131 Encounter for screening for diabetes mellitus: Secondary | ICD-10-CM | POA: Diagnosis not present

## 2019-07-26 DIAGNOSIS — Z0000 Encounter for general adult medical examination without abnormal findings: Secondary | ICD-10-CM | POA: Diagnosis not present

## 2019-07-27 LAB — LIPID PANEL WITH LDL/HDL RATIO
Cholesterol, Total: 198 mg/dL (ref 100–199)
HDL: 44 mg/dL (ref >39)
LDL Chol Calc (NIH): 143 mg/dL — ABNORMAL HIGH (ref 0–99)
LDL/HDL Ratio: 3.3 ratio (ref 0.0–3.6)
Triglycerides: 62 mg/dL (ref 0–149)
VLDL Cholesterol Cal: 11 mg/dL (ref 5–40)

## 2019-07-27 LAB — COMPREHENSIVE METABOLIC PANEL
ALT: 19 IU/L (ref 0–44)
AST: 15 IU/L (ref 0–40)
Albumin/Globulin Ratio: 1.8 (ref 1.2–2.2)
Albumin: 4.2 g/dL (ref 4.0–5.0)
Alkaline Phosphatase: 65 IU/L (ref 39–117)
BUN/Creatinine Ratio: 16 (ref 9–20)
BUN: 14 mg/dL (ref 6–20)
Bilirubin Total: 0.3 mg/dL (ref 0.0–1.2)
CO2: 21 mmol/L (ref 20–29)
Calcium: 9.2 mg/dL (ref 8.7–10.2)
Chloride: 107 mmol/L — ABNORMAL HIGH (ref 96–106)
Creatinine, Ser: 0.87 mg/dL (ref 0.76–1.27)
GFR calc Af Amer: 127 mL/min/{1.73_m2} (ref >59)
GFR calc non Af Amer: 109 mL/min/{1.73_m2} (ref >59)
Globulin, Total: 2.4 g/dL (ref 1.5–4.5)
Glucose: 102 mg/dL — ABNORMAL HIGH (ref 65–99)
Potassium: 4.7 mmol/L (ref 3.5–5.2)
Sodium: 141 mmol/L (ref 134–144)
Total Protein: 6.6 g/dL (ref 6.0–8.5)

## 2019-07-27 LAB — CBC WITH DIFFERENTIAL/PLATELET
Basophils Absolute: 0.1 10*3/uL (ref 0.0–0.2)
Basos: 1 %
EOS (ABSOLUTE): 0.6 10*3/uL — ABNORMAL HIGH (ref 0.0–0.4)
Eos: 6 %
Hematocrit: 44.3 % (ref 37.5–51.0)
Hemoglobin: 15.1 g/dL (ref 13.0–17.7)
Immature Grans (Abs): 0 10*3/uL (ref 0.0–0.1)
Immature Granulocytes: 0 %
Lymphocytes Absolute: 3 10*3/uL (ref 0.7–3.1)
Lymphs: 30 %
MCH: 30.5 pg (ref 26.6–33.0)
MCHC: 34.1 g/dL (ref 31.5–35.7)
MCV: 90 fL (ref 79–97)
Monocytes Absolute: 0.7 10*3/uL (ref 0.1–0.9)
Monocytes: 7 %
Neutrophils Absolute: 5.7 10*3/uL (ref 1.4–7.0)
Neutrophils: 56 %
Platelets: 249 10*3/uL (ref 150–450)
RBC: 4.95 x10E6/uL (ref 4.14–5.80)
RDW: 12.3 % (ref 11.6–15.4)
WBC: 10 10*3/uL (ref 3.4–10.8)

## 2019-07-27 LAB — TSH: TSH: 1.32 u[IU]/mL (ref 0.450–4.500)

## 2019-07-27 LAB — HEMOGLOBIN A1C
Est. average glucose Bld gHb Est-mCnc: 114 mg/dL
Hgb A1c MFr Bld: 5.6 % (ref 4.8–5.6)

## 2019-07-27 LAB — HIV ANTIBODY (ROUTINE TESTING W REFLEX): HIV Screen 4th Generation wRfx: NONREACTIVE

## 2019-07-29 ENCOUNTER — Telehealth: Payer: Self-pay

## 2019-07-29 NOTE — Telephone Encounter (Signed)
Patient's wife advised on lab results.

## 2019-07-29 NOTE — Telephone Encounter (Signed)
-----   Message from Margaretann Loveless, [place] sent at 07/29/2019  7:31 AM EDT ----- Blood count is normal. Kidney and liver function is normal. Sodium, potassium and calcium are normal. Thyroid is normal. Cholesterol is improved from readings 2 years ago. A1c/sugar is normal. HIV screen done once in a lifetime, unless exposed, is negative.

## 2020-06-24 ENCOUNTER — Encounter: Payer: BC Managed Care – PPO | Admitting: Physician Assistant

## 2020-07-10 ENCOUNTER — Ambulatory Visit (INDEPENDENT_AMBULATORY_CARE_PROVIDER_SITE_OTHER): Payer: BC Managed Care – PPO | Admitting: Physician Assistant

## 2020-07-10 ENCOUNTER — Encounter: Payer: Self-pay | Admitting: Physician Assistant

## 2020-07-10 ENCOUNTER — Other Ambulatory Visit: Payer: Self-pay

## 2020-07-10 VITALS — BP 124/85 | HR 95 | Temp 97.0°F | Ht 73.0 in | Wt 283.0 lb

## 2020-07-10 DIAGNOSIS — Z6837 Body mass index (BMI) 37.0-37.9, adult: Secondary | ICD-10-CM | POA: Diagnosis not present

## 2020-07-10 DIAGNOSIS — Z0000 Encounter for general adult medical examination without abnormal findings: Secondary | ICD-10-CM

## 2020-07-10 NOTE — Patient Instructions (Signed)
Preventive Care 40-40 Years Old, Male Preventive care refers to lifestyle choices and visits with your health care provider that can promote health and wellness. This includes:  A yearly physical exam. This is also called an annual wellness visit.  Regular dental and eye exams.  Immunizations.  Screening for certain conditions.  Healthy lifestyle choices, such as: ? Eating a healthy diet. ? Getting regular exercise. ? Not using drugs or products that contain nicotine and tobacco. ? Limiting alcohol use. What can I expect for my preventive care visit? Physical exam Your health care provider may check your:  Height and weight. These may be used to calculate your BMI (body mass index). BMI is a measurement that tells if you are at a healthy weight.  Heart rate and blood pressure.  Body temperature.  Skin for abnormal spots. Counseling Your health care provider may ask you questions about your:  Past medical problems.  Family's medical history.  Alcohol, tobacco, and drug use.  Emotional well-being.  Home life and relationship well-being.  Sexual activity.  Diet, exercise, and sleep habits.  Work and work environment.  Access to firearms. What immunizations do I need? Vaccines are usually given at various ages, according to a schedule. Your health care provider will recommend vaccines for you based on your age, medical history, and lifestyle or other factors, such as travel or where you work.   What tests do I need? Blood tests  Lipid and cholesterol levels. These may be checked every 5 years starting at age 40.  Hepatitis C test.  Hepatitis B test. Screening  Diabetes screening. This is done by checking your blood sugar (glucose) after you have not eaten for a while (fasting).  Genital exam to check for testicular cancer or hernias.  STD (sexually transmitted disease) testing, if you are at risk. Talk with your health care provider about your test results,  treatment options, and if necessary, the need for more tests.   Follow these instructions at home: Eating and drinking  Eat a healthy diet that includes fresh fruits and vegetables, whole grains, lean protein, and low-fat dairy products.  Drink enough fluid to keep your urine pale yellow.  Take vitamin and mineral supplements as recommended by your health care provider.  Do not drink alcohol if your health care provider tells you not to drink.  If you drink alcohol: ? Limit how much you have to 0-2 drinks a day. ? Be aware of how much alcohol is in your drink. In the [place] one drink equals one 12 oz bottle of beer (355 mL), one 5 oz glass of wine (148 mL), or one 1 oz glass of hard liquor (44 mL).   Lifestyle  Take daily care of your teeth and gums. Brush your teeth every morning and night with fluoride toothpaste. Floss one time each day.  Stay active. Exercise for at least 30 minutes 5 or more days each week.  Do not use any products that contain nicotine or tobacco, such as cigarettes, e-cigarettes, and chewing tobacco. If you need help quitting, ask your health care provider.  Do not use drugs.  If you are sexually active, practice safe sex. Use a condom or other form of protection to prevent STIs (sexually transmitted infections).  Find healthy ways to cope with stress, such as: ? Meditation, yoga, or listening to music. ? Journaling. ? Talking to a trusted person. ? Spending time with friends and family. Safety  Always wear your seat belt while driving   or riding in a vehicle.  Do not drive: ? If you have been drinking alcohol. Do not ride with someone who has been drinking. ? When you are tired or distracted. ? While texting.  Wear a helmet and other protective equipment during sports activities.  If you have firearms in your house, make sure you follow all gun safety procedures.  Seek help if you have been physically or sexually abused. What's next?  Go to your  health care provider once a year for an annual wellness visit.  Ask your health care provider how often you should have your eyes and teeth checked.  Stay up to date on all vaccines. This information is not intended to replace advice given to you by your health care provider. Make sure you discuss any questions you have with your health care provider. Document Revised: 12/26/2018 Document Reviewed: 04/05/2018 Elsevier Patient Education  2021 Elsevier Inc.  

## 2020-07-10 NOTE — Progress Notes (Signed)
Complete physical exam   Patient: Jason Manning   DOB: 05-06-1980   39 y.o. Male  MRN: [id number] Visit Date: 07/10/2020  Today's healthcare provider: Margaretann Loveless, PA-C   Chief Complaint  Patient presents with  . Annual Exam   Subjective     HPI  Jason Manning is a 40 y.o. male who presents today for a complete physical exam.  He reports consuming a general diet. The patient has a physically strenuous job, but has no regular exercise apart from work.  He generally feels well. He reports sleeping fairly well. He does not have additional problems to discuss today.     Past Medical History:  Diagnosis Date  . Asthma    Past Surgical History:  Procedure Laterality Date  . fracture of leg Left    Rod Placed  . reattachment of fingers Left   . TONSILLECTOMY    . WISDOM TOOTH EXTRACTION     Social History   Socioeconomic History  . Marital status: Married    Spouse name: Not on file  . Number of children: Not on file  . Years of education: Not on file  . Highest education level: Not on file  Occupational History  . Not on file  Tobacco Use  . Smoking status: Current Every Day Smoker    Packs/day: 2.00    Types: Cigarettes  . Smokeless tobacco: Never Used  Vaping Use  . Vaping Use: Never used  Substance and Sexual Activity  . Alcohol use: No  . Drug use: Yes    Types: Marijuana  . Sexual activity: Yes  Other Topics Concern  . Not on file  Social History Narrative  . Not on file   Social Determinants of Health   Financial Resource Strain: Not on file  Food Insecurity: Not on file  Transportation Needs: Not on file  Physical Activity: Not on file  Stress: Not on file  Social Connections: Not on file  Intimate Partner Violence: Not on file   Family Status  Relation Name Status  . Mother  Alive  . Father  Alive  . Brother  Alive  . Daughter  Alive  . MGM  Deceased  . MGF  Deceased  . PGM  Alive  . PGF  Deceased  .  Daughter  Alive  . Neg Hx  (Not Specified)   Family History  Problem Relation Age of Onset  . Thyroid disease Mother   . Hypertension Mother   . Healthy Father   . Healthy Brother   . Lung disease Maternal Grandfather   . Heart disease Maternal Grandfather   . Colon cancer Paternal Grandfather   . Prostate cancer Neg Hx    No Known Allergies  Patient Care Team: Reine Just as PCP - General (Family Medicine)   Medications: No outpatient medications prior to visit.   No facility-administered medications prior to visit.    Review of Systems  Constitutional: Negative.   HENT: Negative.   Eyes: Negative.   Respiratory: Negative.   Cardiovascular: Negative.   Gastrointestinal: Negative.   Endocrine: Negative.   Genitourinary: Negative.   Musculoskeletal: Negative.   Skin: Negative.   Allergic/Immunologic: Negative.   Neurological: Negative.   Hematological: Negative.   Psychiatric/Behavioral: Negative.       Objective    BP 124/85 (BP Location: Left Arm, Patient Position: Sitting, Cuff Size: Large)   Pulse 95   Temp (!) 97 F (36.1 C) (Axillary)  Ht 6\' 1"  (1.854 m)   Wt 283 lb (128.4 kg)   BMI 37.34 kg/m    Physical Exam Constitutional:      General: He is not in acute distress.    Appearance: Normal appearance. He is well-developed. He is obese. He is not ill-appearing.  HENT:     Head: Normocephalic and atraumatic.     Right Ear: Tympanic membrane, ear canal and external ear normal.     Left Ear: Tympanic membrane, ear canal and external ear normal.     Nose: Nose normal.     Mouth/Throat:     Mouth: Mucous membranes are moist.     Pharynx: Oropharynx is clear. No oropharyngeal exudate or posterior oropharyngeal erythema.  Eyes:     General: No scleral icterus.       Right eye: No discharge.        Left eye: No discharge.     Extraocular Movements: Extraocular movements intact.     Conjunctiva/sclera: Conjunctivae normal.     Pupils:  Pupils are equal, round, and reactive to light.  Neck:     Thyroid: No thyromegaly.     Trachea: No tracheal deviation.  Cardiovascular:     Rate and Rhythm: Normal rate and regular rhythm.     Pulses: Normal pulses.     Heart sounds: Normal heart sounds. No murmur heard.   Pulmonary:     Effort: Pulmonary effort is normal. No respiratory distress.     Breath sounds: Normal breath sounds. No wheezing or rales.  Chest:     Chest wall: No tenderness.  Abdominal:     General: Abdomen is flat. Bowel sounds are normal. There is no distension.     Palpations: Abdomen is soft. There is no mass.     Tenderness: There is no abdominal tenderness. There is no guarding or rebound.  Musculoskeletal:        General: No tenderness. Normal range of motion.     Cervical back: Normal range of motion and neck supple. No tenderness.     Right lower leg: No edema.     Left lower leg: No edema.  Lymphadenopathy:     Cervical: No cervical adenopathy.  Skin:    General: Skin is warm and dry.     Capillary Refill: Capillary refill takes less than 2 seconds.     Findings: No erythema or rash.  Neurological:     General: No focal deficit present.     Mental Status: He is alert and oriented to person, place, and time. Mental status is at baseline.     Cranial Nerves: No cranial nerve deficit.     Motor: No weakness or abnormal muscle tone.     Coordination: Coordination normal.     Gait: Gait normal.     Deep Tendon Reflexes: Reflexes are normal and symmetric. Reflexes normal.  Psychiatric:        Mood and Affect: Mood normal.        Behavior: Behavior normal.        Thought Content: Thought content normal.        Judgment: Judgment normal.       Last depression screening scores PHQ 2/9 Scores 06/24/2019 03/24/2017  PHQ - 2 Score 0 0  PHQ- 9 Score 0 0   Last fall risk screening Fall Risk  06/24/2019  Falls in the past year? 0  Number falls in past yr: 0  Injury with Fall? 0  Follow up Falls  evaluation completed   Last Audit-C alcohol use screening Alcohol Use Disorder Test (AUDIT) 06/24/2019  1. How often do you have a drink containing alcohol? 1  2. How many drinks containing alcohol do you have on a typical day when you are drinking? 0  3. How often do you have six or more drinks on one occasion? 1  AUDIT-C Score 2   A score of 3 or more in women, and 4 or more in men indicates increased risk for alcohol abuse, EXCEPT if all of the points are from question 1   No results found for any visits on 07/10/20.  Assessment & Plan    Routine Health Maintenance and Physical Exam  Exercise Activities and Dietary recommendations Goals   None     Immunization History  Administered Date(s) Administered  . Td 04/25/2006  . Tdap 06/24/2019    Health Maintenance  Topic Date Due  . Hepatitis C Screening  Never done  . COVID-19 Vaccine (1) Never done  . INFLUENZA VACCINE  Never done  . TETANUS/TDAP  06/23/2029  . HIV Screening  Completed  . HPV VACCINES  Aged Out    Discussed health benefits of physical activity, and encouraged him to engage in regular exercise appropriate for his age and condition.  1. Annual physical exam Normal physical exam today. Will check labs as below and f/u pending lab results. If labs are stable and WNL he will not need to have these rechecked for one year at his next annual physical exam. He is to call the office in the meantime if he has any acute issue, questions or concerns. - CBC w/Diff/Platelet - Comprehensive Metabolic Panel (CMET) - Lipid Panel With LDL/HDL Ratio - HgB A1c - TSH  2. Class 2 severe obesity due to excess calories with serious comorbidity and body mass index (BMI) of 37.0 to 37.9 in adult [facility]) Counseled patient on healthy lifestyle modifications including dieting and exercise. Will check labs as below and f/u pending results. - CBC w/Diff/Platelet - Comprehensive Metabolic Panel (CMET) - Lipid Panel With LDL/HDL  Ratio - HgB A1c - TSH   No follow-ups on file.     Delmer Islam, PA-C, have reviewed all documentation for this visit. The documentation on 07/28/20 for the exam, diagnosis, procedures, and orders are all accurate and complete.   Reine Just  [facility] [phone] (phone) [phone] (fax)  [facility]

## 2020-07-11 LAB — LIPID PANEL WITH LDL/HDL RATIO
Cholesterol, Total: 216 mg/dL — ABNORMAL HIGH (ref 100–199)
HDL: 38 mg/dL — ABNORMAL LOW (ref >39)
LDL Chol Calc (NIH): 143 mg/dL — ABNORMAL HIGH (ref 0–99)
LDL/HDL Ratio: 3.8 ratio — ABNORMAL HIGH (ref 0.0–3.6)
Triglycerides: 195 mg/dL — ABNORMAL HIGH (ref 0–149)
VLDL Cholesterol Cal: 35 mg/dL (ref 5–40)

## 2020-07-11 LAB — COMPREHENSIVE METABOLIC PANEL
ALT: 23 IU/L (ref 0–44)
AST: 19 IU/L (ref 0–40)
Albumin/Globulin Ratio: 2 (ref 1.2–2.2)
Albumin: 4.5 g/dL (ref 4.0–5.0)
Alkaline Phosphatase: 60 IU/L (ref 44–121)
BUN/Creatinine Ratio: 19 (ref 9–20)
BUN: 18 mg/dL (ref 6–20)
Bilirubin Total: 0.3 mg/dL (ref 0.0–1.2)
CO2: 20 mmol/L (ref 20–29)
Calcium: 9.6 mg/dL (ref 8.7–10.2)
Chloride: 103 mmol/L (ref 96–106)
Creatinine, Ser: 0.96 mg/dL (ref 0.76–1.27)
Globulin, Total: 2.3 g/dL (ref 1.5–4.5)
Glucose: 103 mg/dL — ABNORMAL HIGH (ref 65–99)
Potassium: 4.4 mmol/L (ref 3.5–5.2)
Sodium: 137 mmol/L (ref 134–144)
Total Protein: 6.8 g/dL (ref 6.0–8.5)
eGFR: 103 mL/min/{1.73_m2} (ref >59)

## 2020-07-11 LAB — CBC WITH DIFFERENTIAL/PLATELET
Basophils Absolute: 0.1 10*3/uL (ref 0.0–0.2)
Basos: 1 %
EOS (ABSOLUTE): 0.5 10*3/uL — ABNORMAL HIGH (ref 0.0–0.4)
Eos: 4 %
Hematocrit: 44 % (ref 37.5–51.0)
Hemoglobin: 14.8 g/dL (ref 13.0–17.7)
Immature Grans (Abs): 0 10*3/uL (ref 0.0–0.1)
Immature Granulocytes: 0 %
Lymphocytes Absolute: 3.6 10*3/uL — ABNORMAL HIGH (ref 0.7–3.1)
Lymphs: 34 %
MCH: 30.3 pg (ref 26.6–33.0)
MCHC: 33.6 g/dL (ref 31.5–35.7)
MCV: 90 fL (ref 79–97)
Monocytes Absolute: 0.7 10*3/uL (ref 0.1–0.9)
Monocytes: 7 %
Neutrophils Absolute: 5.9 10*3/uL (ref 1.4–7.0)
Neutrophils: 54 %
Platelets: 270 10*3/uL (ref 150–450)
RBC: 4.89 x10E6/uL (ref 4.14–5.80)
RDW: 12.7 % (ref 11.6–15.4)
WBC: 10.8 10*3/uL (ref 3.4–10.8)

## 2020-07-11 LAB — HEMOGLOBIN A1C
Est. average glucose Bld gHb Est-mCnc: 126 mg/dL
Hgb A1c MFr Bld: 6 % — ABNORMAL HIGH (ref 4.8–5.6)

## 2020-07-11 LAB — TSH: TSH: 1.31 u[IU]/mL (ref 0.450–4.500)

## 2020-07-16 ENCOUNTER — Telehealth: Payer: Self-pay

## 2020-07-16 NOTE — Telephone Encounter (Signed)
-----   Message from Margaretann Loveless, [place] sent at 07/16/2020  1:10 PM EDT ----- Madelaine Bhat,   Cholesterol is elevated compared to last year. A1c is also increased from last year and in a prediabetic range. Would recommend to work on healthy lifestyle modifications. Limiting fatty foods, red meats, processed foods/meats, and sugars can help. Also increasing physical activity to get in 150 min of moderate activity per week. All other labs are normal and/or stable.   Best Wishes, Daiva Nakayama, PAC

## 2020-07-16 NOTE — Telephone Encounter (Signed)
Please route to result notes. 

## 2020-07-28 ENCOUNTER — Encounter: Payer: Self-pay | Admitting: Physician Assistant

## 2020-08-05 DIAGNOSIS — H90A22 Sensorineural hearing loss, unilateral, left ear, with restricted hearing on the contralateral side: Secondary | ICD-10-CM | POA: Diagnosis not present

## 2021-08-02 DIAGNOSIS — M79671 Pain in right foot: Secondary | ICD-10-CM | POA: Diagnosis not present

## 2021-08-02 DIAGNOSIS — M216X1 Other acquired deformities of right foot: Secondary | ICD-10-CM | POA: Diagnosis not present

## 2021-08-02 DIAGNOSIS — M7731 Calcaneal spur, right foot: Secondary | ICD-10-CM | POA: Diagnosis not present

## 2021-08-02 DIAGNOSIS — M722 Plantar fascial fibromatosis: Secondary | ICD-10-CM | POA: Diagnosis not present

## 2021-08-25 DIAGNOSIS — M79671 Pain in right foot: Secondary | ICD-10-CM | POA: Diagnosis not present

## 2021-08-25 DIAGNOSIS — M722 Plantar fascial fibromatosis: Secondary | ICD-10-CM | POA: Diagnosis not present

## 2021-08-25 DIAGNOSIS — M7731 Calcaneal spur, right foot: Secondary | ICD-10-CM | POA: Diagnosis not present

## 2021-08-25 DIAGNOSIS — M216X1 Other acquired deformities of right foot: Secondary | ICD-10-CM | POA: Diagnosis not present

## 2022-08-25 ENCOUNTER — Encounter: Payer: Self-pay | Admitting: Internal Medicine

## 2022-08-25 ENCOUNTER — Ambulatory Visit (INDEPENDENT_AMBULATORY_CARE_PROVIDER_SITE_OTHER): Payer: BC Managed Care – PPO | Admitting: Internal Medicine

## 2022-08-25 VITALS — BP 122/78 | HR 110 | Temp 98.0°F | Resp 18 | Ht 74.0 in | Wt 284.5 lb

## 2022-08-25 DIAGNOSIS — E785 Hyperlipidemia, unspecified: Secondary | ICD-10-CM

## 2022-08-25 DIAGNOSIS — R7303 Prediabetes: Secondary | ICD-10-CM

## 2022-08-25 DIAGNOSIS — Z1159 Encounter for screening for other viral diseases: Secondary | ICD-10-CM | POA: Diagnosis not present

## 2022-08-25 DIAGNOSIS — Z0000 Encounter for general adult medical examination without abnormal findings: Secondary | ICD-10-CM

## 2022-08-25 NOTE — Progress Notes (Signed)
New Patient Office Visit  Subjective    Patient ID: Jason Manning, male    DOB: 1980-09-07  Age: 42 y.o. MRN: [id number]  CC:  Chief Complaint  Patient presents with   Establish Care    labs    HPI Jason Manning presents to establish care. He has a remote history of asthma, sometimes worse at work Conservation officer, historic buildings). Denies cough, shortness of breath, wheezing. Occasionally uses his daughter's inhaler but doesn't feel like he needs one of his own.   HLD: -Medications: Nothing -Last lipid panel: Lipid Panel     Component Value Date/Time   CHOL 216 (H) 07/10/2020 1631   TRIG 195 (H) 07/10/2020 1631   HDL 38 (L) 07/10/2020 1631   LDLCALC 143 (H) 07/10/2020 1631   LABVLDL 35 07/10/2020 1631   The 10-year ASCVD risk score (Arnett DK, et al., 2019) is: 6.3%   Values used to calculate the score:     Age: 71 years     Sex: Male     Is Non-Hispanic African American: No     Diabetic: No     Tobacco smoker: Yes     Systolic Blood Pressure: 122 mmHg     Is BP treated: No     HDL Cholesterol: 38 mg/dL     Total Cholesterol: 216 mg/dL   Pre-Diabetes: -Last W0J 6.0% 3/22 -Not on any medication currently   Health Maintenance: -Blood work due   No outpatient encounter medications on file as of 08/25/2022.   No facility-administered encounter medications on file as of 08/25/2022.    Past Medical History:  Diagnosis Date   Asthma     Past Surgical History:  Procedure Laterality Date   fracture of leg Left    Rod Placed   reattachment of fingers Left    TONSILLECTOMY     WISDOM TOOTH EXTRACTION      Family History  Problem Relation Age of Onset   Thyroid disease Mother    Hypertension Mother    Healthy Father    Healthy Brother    Lung disease Maternal Grandfather    Heart disease Maternal Grandfather    Colon cancer Paternal Grandfather    Prostate cancer Neg Hx     Social History   Socioeconomic History   Marital status: Married    Spouse  name: Not on file   Number of children: Not on file   Years of education: Not on file   Highest education level: Not on file  Occupational History   Not on file  Tobacco Use   Smoking status: Every Day    Packs/day: 2    Types: Cigarettes   Smokeless tobacco: Never  Vaping Use   Vaping Use: Never used  Substance and Sexual Activity   Alcohol use: No   Drug use: Yes    Types: Marijuana   Sexual activity: Yes  Other Topics Concern   Not on file  Social History Narrative   Not on file   Social Determinants of Health   Financial Resource Strain: Not on file  Food Insecurity: Not on file  Transportation Needs: Not on file  Physical Activity: Not on file  Stress: Not on file  Social Connections: Not on file  Intimate Partner Violence: Not on file    Review of Systems  All other systems reviewed and are negative.       Objective    BP 122/78   Pulse (!) 110   Temp 98 F (  36.7 C)   Resp 18   Ht 6\' 2"  (1.88 m)   Wt 284 lb 8 oz (129 kg)   SpO2 95%   BMI 36.53 kg/m   Physical Exam Constitutional:      Appearance: Normal appearance.  HENT:     Head: Normocephalic and atraumatic.  Eyes:     Conjunctiva/sclera: Conjunctivae normal.  Cardiovascular:     Rate and Rhythm: Normal rate and regular rhythm.  Pulmonary:     Effort: Pulmonary effort is normal.     Breath sounds: Normal breath sounds.  Musculoskeletal:     Right lower leg: No edema.     Left lower leg: No edema.  Skin:    General: Skin is warm and dry.  Neurological:     General: No focal deficit present.     Mental Status: He is alert. Mental status is at baseline.  Psychiatric:        Mood and Affect: Mood normal.        Behavior: Behavior normal.         Assessment & Plan:   1. Annual physical exam/Hyperlipidemia, unspecified hyperlipidemia type/Prediabetes/Encounter for hepatitis C screening test for low risk patient: Obtain fasting labs. Discussed working on decreasing sugar and carbs  in the diet. Follow up in 1 year or sooner as needed.  - Hepatitis C Antibody - CBC w/Diff/Platelet - Lipid Profile - HgB A1c - Comprehensive Metabolic Panel (CMET)   Return in about 1 year (around 08/25/2023).   Jason Mail, DO

## 2022-09-06 DIAGNOSIS — Z0000 Encounter for general adult medical examination without abnormal findings: Secondary | ICD-10-CM | POA: Diagnosis not present

## 2022-09-06 DIAGNOSIS — Z1159 Encounter for screening for other viral diseases: Secondary | ICD-10-CM | POA: Diagnosis not present

## 2022-09-06 DIAGNOSIS — Z13220 Encounter for screening for lipoid disorders: Secondary | ICD-10-CM | POA: Diagnosis not present

## 2022-09-06 DIAGNOSIS — R7303 Prediabetes: Secondary | ICD-10-CM | POA: Diagnosis not present

## 2022-09-06 DIAGNOSIS — E785 Hyperlipidemia, unspecified: Secondary | ICD-10-CM | POA: Diagnosis not present

## 2022-09-07 LAB — COMPREHENSIVE METABOLIC PANEL
ALT: 19 IU/L (ref 0–44)
AST: 15 IU/L (ref 0–40)
Albumin/Globulin Ratio: 2 (ref 1.2–2.2)
Albumin: 4.1 g/dL (ref 4.1–5.1)
Alkaline Phosphatase: 68 IU/L (ref 44–121)
BUN/Creatinine Ratio: 19 (ref 9–20)
BUN: 16 mg/dL (ref 6–24)
Bilirubin Total: 0.3 mg/dL (ref 0.0–1.2)
CO2: 21 mmol/L (ref 20–29)
Calcium: 9.2 mg/dL (ref 8.7–10.2)
Chloride: 105 mmol/L (ref 96–106)
Creatinine, Ser: 0.83 mg/dL (ref 0.76–1.27)
Globulin, Total: 2.1 g/dL (ref 1.5–4.5)
Glucose: 106 mg/dL — ABNORMAL HIGH (ref 70–99)
Potassium: 4.7 mmol/L (ref 3.5–5.2)
Sodium: 138 mmol/L (ref 134–144)
Total Protein: 6.2 g/dL (ref 6.0–8.5)
eGFR: 113 mL/min/{1.73_m2} (ref >59)

## 2022-09-07 LAB — LIPID PANEL
Chol/HDL Ratio: 5.4 ratio — ABNORMAL HIGH (ref 0.0–5.0)
Cholesterol, Total: 209 mg/dL — ABNORMAL HIGH (ref 100–199)
HDL: 39 mg/dL — ABNORMAL LOW (ref >39)
LDL Chol Calc (NIH): 147 mg/dL — ABNORMAL HIGH (ref 0–99)
Triglycerides: 127 mg/dL (ref 0–149)
VLDL Cholesterol Cal: 23 mg/dL (ref 5–40)

## 2022-09-07 LAB — CBC WITH DIFFERENTIAL/PLATELET
Basophils Absolute: 0.1 10*3/uL (ref 0.0–0.2)
Basos: 1 %
EOS (ABSOLUTE): 0.5 10*3/uL — ABNORMAL HIGH (ref 0.0–0.4)
Eos: 5 %
Hematocrit: 46.5 % (ref 37.5–51.0)
Hemoglobin: 15.5 g/dL (ref 13.0–17.7)
Immature Grans (Abs): 0 10*3/uL (ref 0.0–0.1)
Immature Granulocytes: 0 %
Lymphocytes Absolute: 3.7 10*3/uL — ABNORMAL HIGH (ref 0.7–3.1)
Lymphs: 34 %
MCH: 30 pg (ref 26.6–33.0)
MCHC: 33.3 g/dL (ref 31.5–35.7)
MCV: 90 fL (ref 79–97)
Monocytes Absolute: 0.8 10*3/uL (ref 0.1–0.9)
Monocytes: 8 %
Neutrophils Absolute: 5.6 10*3/uL (ref 1.4–7.0)
Neutrophils: 52 %
Platelets: 269 10*3/uL (ref 150–450)
RBC: 5.16 x10E6/uL (ref 4.14–5.80)
RDW: 12.3 % (ref 11.6–15.4)
WBC: 10.7 10*3/uL (ref 3.4–10.8)

## 2022-09-07 LAB — HEMOGLOBIN A1C
Est. average glucose Bld gHb Est-mCnc: 120 mg/dL
Hgb A1c MFr Bld: 5.8 % — ABNORMAL HIGH (ref 4.8–5.6)

## 2022-09-07 LAB — HEPATITIS C ANTIBODY: Hep C Virus Ab: NONREACTIVE

## 2022-10-22 ENCOUNTER — Other Ambulatory Visit: Payer: Self-pay

## 2022-10-22 ENCOUNTER — Emergency Department
Admission: EM | Admit: 2022-10-22 | Discharge: 2022-10-22 | Disposition: A | Discharge status: Home / Self Care | Payer: BC Managed Care – PPO | Attending: Emergency Medicine | Admitting: Emergency Medicine

## 2022-10-22 DIAGNOSIS — S61412A Laceration without foreign body of left hand, initial encounter: Secondary | ICD-10-CM

## 2022-10-22 DIAGNOSIS — S6992XA Unspecified injury of left wrist, hand and finger(s), initial encounter: Secondary | ICD-10-CM | POA: Diagnosis not present

## 2022-10-22 DIAGNOSIS — W268XXA Contact with other sharp object(s), not elsewhere classified, initial encounter: Secondary | ICD-10-CM | POA: Insufficient documentation

## 2022-10-22 MED ORDER — CEPHALEXIN 500 MG PO CAPS: 500.0000 mg | ORAL_CAPSULE | Freq: Three times a day (TID) | ORAL | 0 refills | Status: AC

## 2022-10-22 MED ORDER — LIDOCAINE HCL 1 % IJ SOLN
5.0000 mL | Freq: Once | INTRAMUSCULAR | Status: AC
  Administered 2022-10-22: 5 mL
  Filled 2022-10-22: qty 10

## 2022-10-22 NOTE — Discharge Instructions (Addendum)
Take Keflex three times daily for the next seven days. Remove sutures in seven days.  

## 2022-10-22 NOTE — ED Triage Notes (Signed)
Pt to ed from home via POV for laceration to left hand with boxblade. Pt has about 1/2 in laceration to posterior hand bleeding controlled in triage. Pt is caox4, in no acute distress and ambulatory in triage.

## 2022-10-22 NOTE — ED Provider Notes (Signed)
[facility] Provider Note  Patient Contact: 10:51 PM (approximate)   History   Laceration (Left hand)   HPI  Jason Manning is a 42 y.o. male presents to the emergency department with a 2 cm laceration along the dorsal aspect of the left hand.  No numbness or tingling in the left hand.  No similar injuries in the past.  Tetanus status is up-to-date.      Physical Exam   Triage Vital Signs: ED Triage Vitals  Enc Vitals Group     BP 10/22/22 2117 132/85     Pulse Rate 10/22/22 2117 89     Resp 10/22/22 2117 16     Temp 10/22/22 2117 98 F (36.7 C)     Temp Source 10/22/22 2117 Oral     SpO2 10/22/22 2117 98 %     Weight 10/22/22 2115 284 lb 6.3 oz (129 kg)     Height 10/22/22 2115 6\' 2"  (1.88 m)     Head Circumference --      Peak Flow --      Pain Score 10/22/22 2115 0     Pain Loc --      Pain Edu? --      Excl. in GC? --     Most recent vital signs: Vitals:   10/22/22 2117  BP: 132/85  Pulse: 89  Resp: 16  Temp: 98 F (36.7 C)  SpO2: 98%     General: Alert and in no acute distress. Eyes:  PERRL. EOMI. Head: No acute traumatic findings ENT:      Nose: No congestion/rhinnorhea.      Mouth/Throat: Mucous membranes are moist. Neck: No stridor. No cervical spine tenderness to palpation. Cardiovascular:  Good peripheral perfusion Respiratory: Normal respiratory effort without tachypnea or retractions. Lungs CTAB. Good air entry to the bases with no decreased or absent breath sounds. Gastrointestinal: Bowel sounds 4 quadrants. Soft and nontender to palpation. No guarding or rigidity. No palpable masses. No distention. No CVA tenderness. Musculoskeletal: Full range of motion to all extremities.  Neurologic:  No gross focal neurologic deficits are appreciated.  Skin: Patient has 2 cm hand laceration along the dorsal aspect of the left hand. Other:   ED Results / Procedures / Treatments   Labs (all labs ordered are listed,  but only abnormal results are displayed) Labs Reviewed - No data to display      PROCEDURES:  Critical Care performed: No  ..Laceration Repair  Date/Time: 10/22/2022 10:52 PM  Performed by: Orvil Feil, PA-C Authorized by: Orvil Feil, PA-C   Consent:    Consent obtained:  Verbal   Risks discussed:  Infection and pain Universal protocol:    Procedure explained and questions answered to patient or proxy's satisfaction: yes     Patient identity confirmed:  Verbally with patient Anesthesia:    Anesthesia method:  Local infiltration   Local anesthetic:  Lidocaine 1% w/o epi Laceration details:    Location:  Hand   Hand location:  L palm   Length (cm):  2   Depth (mm):  5 Pre-procedure details:    Preparation:  Patient was prepped and draped in usual sterile fashion Exploration:    Limited defect created (wound extended): no     Contaminated: no   Treatment:    Area cleansed with:  Povidone-iodine   Amount of cleaning:  Standard   Irrigation solution:  Sterile saline   Debridement:  None Skin repair:  Repair method:  Sutures   Suture size:  4-0   Suture technique:  Simple interrupted and running locked   Number of sutures:  3 Approximation:    Approximation:  Close Repair type:    Repair type:  Simple Post-procedure details:    Dressing:  Non-adherent dressing   Procedure completion:  Tolerated well, no immediate complications    MEDICATIONS ORDERED IN ED: Medications  lidocaine (XYLOCAINE) 1 % (with pres) injection 5 mL (5 mLs Infiltration Given 10/22/22 2140)     IMPRESSION / MDM / ASSESSMENT AND PLAN / ED COURSE  I reviewed the triage vital signs and the nursing notes.                              Assessment and plan Hand laceration 42 year old male presents to the emergency department with a left hand laceration.  Vital signs are reassuring at triage.  On exam, patient was alert, active and nontoxic-appearing.  Hand laceration was repaired  without complication.  Recommended suture removal in 7 days.  Patient was discharged with Keflex.  Return precautions were given to return with new or worsening symptoms.     FINAL CLINICAL IMPRESSION(S) / ED DIAGNOSES   Final diagnoses:  Laceration of left hand without foreign body, initial encounter     Rx / DC Orders   ED Discharge Orders          Ordered    cephALEXin (KEFLEX) 500 MG capsule  3 times daily        10/22/22 2145             Note:  This document was prepared using Dragon voice recognition software and may include unintentional dictation errors.   Pia Mau [place], PA-C 10/22/22 2254    Pilar Jarvis, MD 10/22/22 [phone]

## 2023-07-03 ENCOUNTER — Ambulatory Visit
Admission: RE | Admit: 2023-07-03 | Discharge: 2023-07-03 | Disposition: A | Discharge status: Home / Self Care | Attending: Internal Medicine | Admitting: Internal Medicine

## 2023-07-03 ENCOUNTER — Ambulatory Visit
Admission: RE | Admit: 2023-07-03 | Discharge: 2023-07-03 | Disposition: A | Discharge status: Home / Self Care | Source: Ambulatory Visit | Attending: Internal Medicine | Admitting: Internal Medicine

## 2023-07-03 ENCOUNTER — Encounter: Payer: Self-pay | Admitting: Internal Medicine

## 2023-07-03 ENCOUNTER — Ambulatory Visit: Admitting: Internal Medicine

## 2023-07-03 VITALS — BP 136/82 | HR 104 | Resp 18 | Ht 74.0 in | Wt 288.6 lb

## 2023-07-03 DIAGNOSIS — R509 Fever, unspecified: Secondary | ICD-10-CM

## 2023-07-03 DIAGNOSIS — R051 Acute cough: Secondary | ICD-10-CM

## 2023-07-03 LAB — POC COVID19/FLU A&B COMBO
Covid Antigen, POC: NEGATIVE
Influenza A Antigen, POC: NEGATIVE
Influenza B Antigen, POC: NEGATIVE

## 2023-07-03 MED ORDER — BENZONATATE 100 MG PO CAPS: 100.0000 mg | ORAL_CAPSULE | Freq: Two times a day (BID) | ORAL | 0 refills | Status: AC | PRN

## 2023-07-03 MED ORDER — AMOXICILLIN-POT CLAVULANATE 875-125 MG PO TABS
1.0000 | ORAL_TABLET | Freq: Two times a day (BID) | ORAL | 0 refills | Status: AC
Start: 2023-07-03 — End: 2023-07-08

## 2023-07-03 MED ORDER — METHYLPREDNISOLONE 4 MG PO TBPK
ORAL_TABLET | ORAL | 0 refills | Status: AC
Start: 2023-07-03 — End: ?

## 2023-07-03 NOTE — Progress Notes (Signed)
 Acute Office Visit  Subjective:     Patient ID: Jason Manning, male    DOB: Jul 15, 1980, 43 y.o.   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 Pertinent negatives include no chest pain.   Patient is in today for cough, congestion, body aches x 1 week.  Patient states his coworkers had similar symptoms.  His symptoms started about a week ago, had been improving for a few days but then yesterday returned and were more severe.  URI Compliant:  -Fever: yes, this morning 102 -Cough: yes, production, dark green  -Shortness of breath: no -Wheezing: yes -Nasal congestion: yes -Runny nose: no -Post nasal drip: yes -Sore throat: yes -Swollen glands: yes -Sinus pressure: no -Headache: yes -Face pain: no -Ear pain: no  -Ear pressure: no  -Vomiting: no -Fatigue: yes -Sick contacts: yes -Context: worse -Relief with OTC cold/cough medications: no  -Treatments attempted: Nightquil, pseudoephedrine     Review of Systems  Constitutional:  Positive for chills, fever and malaise/fatigue.  HENT:  Positive for congestion, ear pain and sore throat.   Respiratory:  Positive for cough, sputum production and wheezing. Negative for shortness of breath.   Cardiovascular:  Negative for chest pain.  Neurological:  Positive for headaches.        Objective:    BP 136/82   Pulse (!) 104   Resp 18   Ht 6\' 2"  (1.88 m)   Wt 288 lb 9.6 oz (130.9 kg)   SpO2 99%   BMI 37.05 kg/m  BP Readings from Last 3 Encounters:  07/03/23 136/82  10/22/22 132/85  08/25/22 122/78   Wt Readings from Last 3 Encounters:  07/03/23 288 lb 9.6 oz (130.9 kg)  10/22/22 284 lb 6.3 oz (129 kg)  08/25/22 284 lb 8 oz (129 kg)      Physical Exam Constitutional:      Appearance: Normal  appearance. He is ill-appearing.  HENT:     Head: Normocephalic and atraumatic.     Right Ear: Tympanic membrane and external ear normal.     Left Ear: Tympanic membrane and external ear normal.     Ears:     Comments: Erythematous bilateral ear canals    Nose: Congestion present.     Mouth/Throat:     Mouth: Mucous membranes are moist.     Pharynx: Posterior oropharyngeal erythema present.  Eyes:     Conjunctiva/sclera: Conjunctivae normal.  Cardiovascular:     Rate and Rhythm: Normal rate and regular rhythm.  Pulmonary:     Effort: Pulmonary effort is normal.     Comments: Decreased air movement throughout Skin:    General: Skin is warm and dry.  Neurological:     General: No focal deficit present.     Mental Status: He is alert. Mental status is at baseline.  Psychiatric:        Mood and Affect: Mood normal.        Behavior: Behavior normal.     No results found for any visits on 07/03/23.      Assessment & Plan:   1. Acute cough (Primary)/Fever, unspecified fever cause: Rapid flu and COVID tests negative.  Concern for overlapping bacterial infection since his symptoms have started to improve but then worsened in severity.  Will send for chest x-ray today and prescribe cough suppressants, Augmentin and steroid pack.  Patient will go home and rest, stay well-hydrated and alternate Tylenol and Motrin for fevers.  Patient will call back if symptoms worsen or fail to improve.  - POC Covid19/Flu A&B Antigen - DG Chest 2 View; Future - amoxicillin-clavulanate (AUGMENTIN) 875-125 MG tablet; Take 1 tablet by mouth 2 (two) times daily for 5 days.  Dispense: 10 tablet; Refill: 0 - methylPREDNISolone (MEDROL DOSEPAK) 4 MG TBPK tablet; Use as directed.  Dispense: 21 each; Refill: 0 - benzonatate (TESSALON) 100 MG capsule; Take 1 capsule (100 mg total) by mouth 2 (two) times daily as needed for cough.  Dispense: 20 capsule; Refill: 0   Return if symptoms worsen or fail to  improve.  Margarita Mail, DO

## 2023-08-28 ENCOUNTER — Encounter: Payer: BC Managed Care – PPO | Admitting: Internal Medicine

## 2023-08-28 NOTE — Progress Notes (Deleted)
 Name: Jason Manning   MRN: [id number]    DOB: 1980/08/20   Date:08/28/2023       Progress Note  Subjective  Chief Complaint  No chief complaint on file.   HPI  Patient presents for annual CPE ***.    Diet: *** Exercise: *** Last Dental Exam: **** Last Eye Exam: ***  Depression: phq 9 is {gen pos [id number]}    08/25/2022    2:31 PM 07/10/2020    4:26 PM 06/24/2019    6:35 PM 03/24/2017    4:42 PM  Depression screen PHQ 2/9  Decreased Interest 0 0 0   Down, Depressed, Hopeless 0 0 0 0  PHQ - 2 Score 0 0 0 0  Altered sleeping 0  0 0  Tired, decreased energy 0  0 0  Change in appetite 0  0 0  Feeling bad or failure about yourself  0  0 0  Trouble concentrating 0  0 0  Moving slowly or fidgety/restless 0  0 0  Suicidal thoughts 0  0 0  PHQ-9 Score 0  0 0  Difficult doing work/chores Not difficult at all  Not difficult at all     Hypertension:  BP Readings from Last 3 Encounters:  07/03/23 136/82  10/22/22 132/85  08/25/22 122/78    Obesity: Wt Readings from Last 3 Encounters:  07/03/23 288 lb 9.6 oz (130.9 kg)  10/22/22 284 lb 6.3 oz (129 kg)  08/25/22 284 lb 8 oz (129 kg)   BMI Readings from Last 3 Encounters:  07/03/23 37.05 kg/m  10/22/22 36.51 kg/m  08/25/22 36.53 kg/m     [employer] Visit [facility] 06/24/2019 in [facility]  AUDIT-C Score 2      ***  Married STD testing and prevention (HIV/chl/gon/syphilis):  {yes/no/default n/a:21102::"not applicable"} Sexual history:  Hep C Screening: completed Skin cancer: Discussed monitoring for atypical lesions Colorectal cancer: NA Prostate cancer:  not applicable No results found for: "PSA"   Lung cancer:  Low Dose CT Chest recommended if Age 87-80 years, 30 pack-year currently smoking OR have quit w/in 15years. Patient  {ACTION; IS/IS [id number]} a candidate for screening   AAA: The USPSTF recommends one-time screening with ultrasonography in men ages 61 to 75  years who have ever smoked. Patient   {ACTION; IS/IS [id number]} a candidate for screening  ECG:  ***  Vaccines:*** reviewed with the patient.   Advanced Care Planning: A voluntary discussion about advance care planning including the explanation and discussion of advance directives.  Discussed health care proxy and Living will, and the patient was able to identify a health care proxy as ***.  Patient {DOES_DOES MWU:13244} have a living will and power of attorney of health care   Patient Active Problem List   Diagnosis Date Noted   Hyperlipidemia 08/25/2022   Prediabetes 08/25/2022   Sinusitis, acute 02/19/2015   Tobacco abuse 02/19/2015    Past Surgical History:  Procedure Laterality Date   fracture of leg Left    Rod Placed   reattachment of fingers Left    TONSILLECTOMY     WISDOM TOOTH EXTRACTION      Family History  Problem Relation Age of Onset   Thyroid disease Mother    Hypertension Mother    Healthy Father    Healthy Brother    Lung disease Maternal Grandfather    Heart disease Maternal Grandfather    Colon cancer Paternal Grandfather    Prostate cancer Neg Hx  Social History   Socioeconomic History   Marital status: Married    Spouse name: Not on file   Number of children: Not on file   Years of education: Not on file   Highest education level: Not on file  Occupational History   Not on file  Tobacco Use   Smoking status: Every Day    Current packs/day: 2.00    Types: Cigarettes   Smokeless tobacco: Never  Vaping Use   Vaping status: Never Used  Substance and Sexual Activity   Alcohol use: No   Drug use: Yes    Types: Marijuana   Sexual activity: Yes  Other Topics Concern   Not on file  Social History Narrative   Not on file   Social Drivers of Health   Financial Resource Strain: Not on file  Food Insecurity: Not on file  Transportation Needs: Not on file  Physical Activity: Not on file  Stress: Not on file  Social Connections: Not  on file  Intimate Partner Violence: Not on file     Current Outpatient Medications:    benzonatate  (TESSALON ) 100 MG capsule, Take 1 capsule (100 mg total) by mouth 2 (two) times daily as needed for cough., Disp: 20 capsule, Rfl: 0   methylPREDNISolone  (MEDROL  DOSEPAK) 4 MG TBPK tablet, Use as directed., Disp: 21 each, Rfl: 0  No Known Allergies   ROS  ***   Objective  There were no vitals filed for this visit.  There is no height or weight on file to calculate BMI.  Physical Exam ***  {Show previous labs (optional):23779}  Assessment & Plan There are no diagnoses linked to this encounter.   -Prostate cancer screening and PSA options (with potential risks and benefits of testing vs not testing) were discussed along with recent recs/guidelines. -USPSTF grade A and B recommendations reviewed with patient; age-appropriate recommendations, preventive care, screening tests, etc discussed and encouraged; healthy living encouraged; see AVS for patient education given to patient -Discussed importance of 150 minutes of physical activity weekly, eat two servings of fish weekly, eat one serving of tree nuts ( cashews, pistachios, pecans, almonds.Aaron Aas) every other day, eat 6 servings of fruit/vegetables daily and drink plenty of water and avoid sweet beverages.  -Reviewed Health Maintenance: {yes/no/default n/a:21102::"not applicable"}
